# Patient Record
Sex: Female | Born: 1965 | Race: White | Hispanic: No | Marital: Married | Smoking: Never smoker
Health system: Southern US, Community
[De-identification: ages and names within clinical notes are randomized; demographics above are authoritative.]

## PROBLEM LIST (undated history)

## (undated) DIAGNOSIS — M1990 Unspecified osteoarthritis, unspecified site: Secondary | ICD-10-CM

## (undated) DIAGNOSIS — E7800 Pure hypercholesterolemia, unspecified: Secondary | ICD-10-CM

## (undated) DIAGNOSIS — Z309 Encounter for contraceptive management, unspecified: Secondary | ICD-10-CM

## (undated) HISTORY — DX: Unspecified osteoarthritis, unspecified site: M19.90

## (undated) HISTORY — DX: Pure hypercholesterolemia, unspecified: E78.00

## (undated) HISTORY — DX: Encounter for contraceptive management, unspecified: Z30.9

## (undated) HISTORY — PX: COLONOSCOPY: SHX174

## (undated) HISTORY — PX: WISDOM TOOTH EXTRACTION: SHX21

## (undated) HISTORY — PX: OTHER SURGICAL HISTORY: SHX169

---

## 2001-10-17 ENCOUNTER — Encounter: Payer: Self-pay | Admitting: Family Medicine

## 2001-10-17 ENCOUNTER — Ambulatory Visit (HOSPITAL_COMMUNITY): Admission: RE | Admit: 2001-10-17 | Discharge: 2001-10-17 | Payer: Self-pay | Admitting: Family Medicine

## 2002-05-18 ENCOUNTER — Ambulatory Visit (HOSPITAL_COMMUNITY): Admission: RE | Admit: 2002-05-18 | Discharge: 2002-05-18 | Payer: Self-pay | Admitting: Internal Medicine

## 2005-04-24 ENCOUNTER — Ambulatory Visit: Payer: Self-pay | Admitting: Internal Medicine

## 2005-04-29 ENCOUNTER — Encounter: Payer: Self-pay | Admitting: Internal Medicine

## 2005-04-29 ENCOUNTER — Ambulatory Visit (HOSPITAL_COMMUNITY): Admission: RE | Admit: 2005-04-29 | Discharge: 2005-04-29 | Payer: Self-pay | Admitting: Internal Medicine

## 2005-04-29 ENCOUNTER — Ambulatory Visit: Payer: Self-pay | Admitting: Internal Medicine

## 2005-06-04 ENCOUNTER — Ambulatory Visit: Payer: Self-pay | Admitting: Internal Medicine

## 2005-09-18 ENCOUNTER — Ambulatory Visit: Payer: Self-pay | Admitting: Internal Medicine

## 2005-12-24 ENCOUNTER — Ambulatory Visit: Payer: Self-pay | Admitting: Internal Medicine

## 2006-02-18 ENCOUNTER — Ambulatory Visit (HOSPITAL_COMMUNITY): Admission: RE | Admit: 2006-02-18 | Discharge: 2006-02-18 | Payer: Self-pay | Admitting: Obstetrics and Gynecology

## 2006-06-16 ENCOUNTER — Ambulatory Visit (HOSPITAL_COMMUNITY): Admission: RE | Admit: 2006-06-16 | Discharge: 2006-06-16 | Payer: Self-pay | Admitting: Obstetrics and Gynecology

## 2006-07-23 ENCOUNTER — Ambulatory Visit (HOSPITAL_COMMUNITY): Admission: RE | Admit: 2006-07-23 | Discharge: 2006-07-23 | Payer: Self-pay | Admitting: Family Medicine

## 2006-08-10 ENCOUNTER — Ambulatory Visit: Payer: Self-pay | Admitting: Internal Medicine

## 2006-11-15 ENCOUNTER — Ambulatory Visit: Payer: Self-pay | Admitting: Internal Medicine

## 2006-12-14 ENCOUNTER — Ambulatory Visit: Payer: Self-pay | Admitting: Internal Medicine

## 2007-01-03 ENCOUNTER — Ambulatory Visit: Payer: Self-pay | Admitting: Internal Medicine

## 2007-01-07 ENCOUNTER — Inpatient Hospital Stay (HOSPITAL_COMMUNITY): Admission: RE | Admit: 2007-01-07 | Discharge: 2007-01-11 | Payer: Self-pay | Admitting: Internal Medicine

## 2007-01-07 ENCOUNTER — Ambulatory Visit: Payer: Self-pay | Admitting: Internal Medicine

## 2007-01-07 ENCOUNTER — Encounter (INDEPENDENT_AMBULATORY_CARE_PROVIDER_SITE_OTHER): Payer: Self-pay | Admitting: Specialist

## 2007-01-07 ENCOUNTER — Ambulatory Visit (HOSPITAL_COMMUNITY): Admission: RE | Admit: 2007-01-07 | Discharge: 2007-01-07 | Payer: Self-pay | Admitting: Internal Medicine

## 2007-01-20 ENCOUNTER — Ambulatory Visit: Payer: Self-pay | Admitting: Internal Medicine

## 2007-01-31 ENCOUNTER — Ambulatory Visit: Payer: Self-pay | Admitting: Internal Medicine

## 2007-02-21 ENCOUNTER — Ambulatory Visit (HOSPITAL_COMMUNITY): Admission: RE | Admit: 2007-02-21 | Discharge: 2007-02-21 | Payer: Self-pay | Admitting: Obstetrics and Gynecology

## 2007-07-14 ENCOUNTER — Emergency Department (HOSPITAL_COMMUNITY): Admission: EM | Admit: 2007-07-14 | Discharge: 2007-07-15 | Payer: Self-pay | Admitting: Emergency Medicine

## 2007-11-25 ENCOUNTER — Ambulatory Visit (HOSPITAL_COMMUNITY): Admission: RE | Admit: 2007-11-25 | Discharge: 2007-11-25 | Payer: Self-pay | Admitting: Internal Medicine

## 2008-03-07 ENCOUNTER — Ambulatory Visit (HOSPITAL_COMMUNITY): Admission: RE | Admit: 2008-03-07 | Discharge: 2008-03-07 | Payer: Self-pay | Admitting: Obstetrics and Gynecology

## 2009-03-27 ENCOUNTER — Ambulatory Visit (HOSPITAL_COMMUNITY): Admission: RE | Admit: 2009-03-27 | Discharge: 2009-03-27 | Payer: Self-pay | Admitting: Obstetrics and Gynecology

## 2009-04-30 IMAGING — CR DG CHEST 2V
2 series · 2 of 2 positions shown · non-contrast
Comparison: none

CLINICAL DATA: Injury, pain.  
 PA AND LATERAL CHEST ? 2 VIEW:

[view not recorded (1 of 2)]
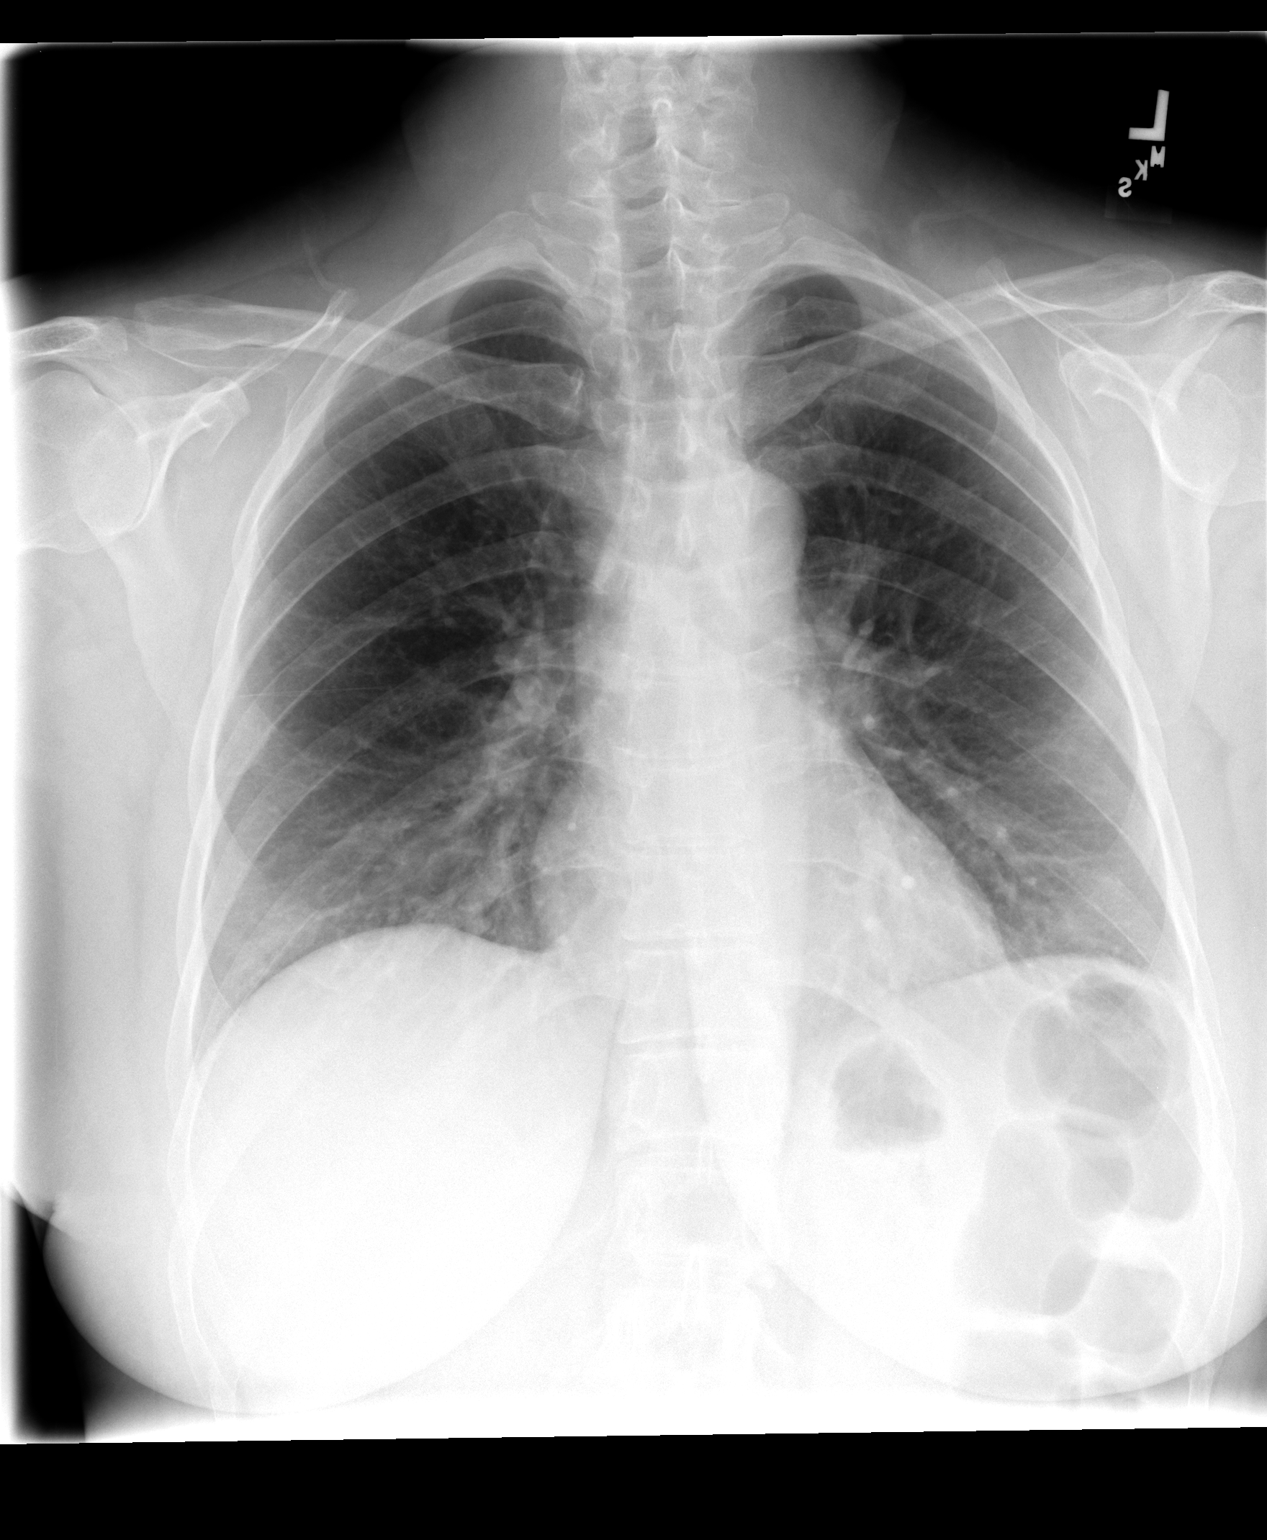

[view not recorded (2 of 2)]
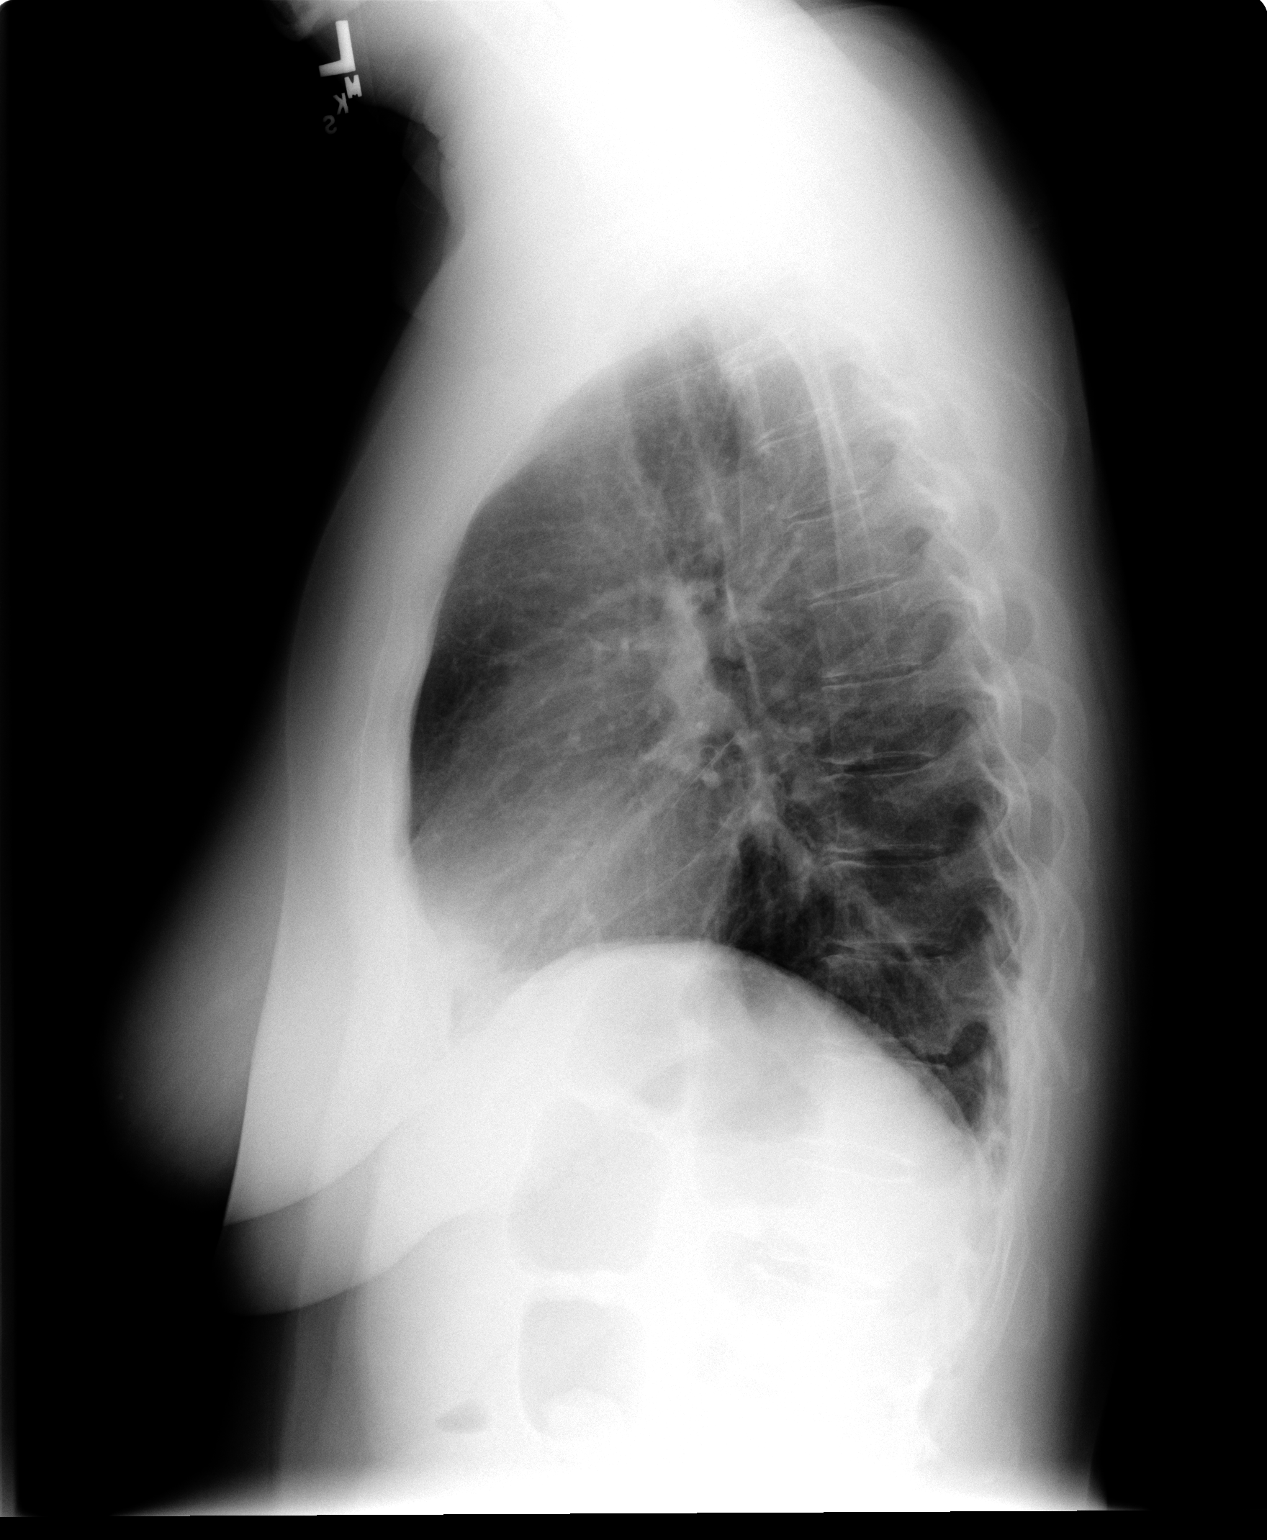

[2 of 2 positions shown; findings below may reference images not displayed]

FINDINGS: The lungs are clear.  No pneumothorax.  No pleural effusion.  Heart and mediastinum appear normal.  No focal bony abnormality.
IMPRESSION: Negative chest.

## 2009-07-19 HISTORY — PX: SIGMOIDOSCOPY: SUR1295

## 2010-03-31 ENCOUNTER — Ambulatory Visit (HOSPITAL_COMMUNITY): Admission: RE | Admit: 2010-03-31 | Discharge: 2010-03-31 | Payer: Self-pay | Admitting: Obstetrics and Gynecology

## 2010-04-03 ENCOUNTER — Ambulatory Visit (HOSPITAL_COMMUNITY): Admission: RE | Admit: 2010-04-03 | Discharge: 2010-04-03 | Payer: Self-pay | Admitting: Family Medicine

## 2010-10-28 ENCOUNTER — Ambulatory Visit: Payer: Self-pay | Admitting: Internal Medicine

## 2011-02-24 ENCOUNTER — Other Ambulatory Visit (HOSPITAL_COMMUNITY): Payer: Self-pay | Admitting: Obstetrics and Gynecology

## 2011-02-24 DIAGNOSIS — Z139 Encounter for screening, unspecified: Secondary | ICD-10-CM

## 2011-03-27 NOTE — Op Note (Signed)
[facility]  Patient:    Sandra Crane, Sandra Crane Visit Number: [id number] MRN: [id number]          Service Type: END Location: DAY Attending Physician:  Bridgette Habermann Dictated by:   Garfield Cornea, M.D. Admit Date:  05/18/2002 Discharge Date: 05/18/2002   CC:         [employer]mond [employer], M.D.   Operative Report  PROCEDURE:  Colonoscopy, biopsy, stool sampling.  INDICATIONS FOR PROCEDURE:  The patient is a 45 year old lady with a recent 10-day history of bloody diarrhea.  I saw this lady on May 10, 2002.  Symptoms were improving.  She tells me now that she is not passing any more blood per rectum.  Her stools are formed up; in fact, she tells me that she may have a bowel movement only every other day.  Colonoscopy is now being done to further evaluate blood per rectum.  This approach has been discussed with Ms. Dorothyann Peng at length.  The potential risks, benefits, and alternatives have been reviewed.  The patient is at low level for conscious sedation with Versed and Demerol.  Please see my May 10, 2002, consultation note for more information.  PROCEDURE NOTE:  O2 saturation, blood pressure, pulse, and respirations were monitored throughout the entire procedure.  CONSCIOUS SEDATION:  IV Versed and Demerol in incremental doses.  INSTRUMENT:  Olympus video chip colonoscope.  FINDINGS: Digital rectal examination revealed no abnormality.  ENDOSCOPIC FINDINGS:  Prep was good.  RECTUM:  Examination of rectal mucosa including retroflexed view of the anal verge revealed patchy erythema and scattered erosions in the proximal rectum; otherwise, the rectum appeared normal.  These changes were seen well into the sigmoid colon but tapered off to 40 cm.  Please see photos.  The colonoscope was easily advanced from the rectosigmoid junction to the left transverse and right colon to the area of the appendiceal orifice, ileocecal valve, and cecum. These structures were  well-seen and photographed for the record.  The colonic mucosa from the more proximal sigmoid onto the cecum appeared normal from the level of the cecum and ileocecal valve.  The scope was slowly withdrawn.  All previously mentioned mucosal surfaces were again seen, and again no other abnormalities were observed.  Biopsies of the sigmoid and rectal mucosa were taken.  Stool was suctioned for microbiology studies.  The patient tolerated the procedure well and was reacted in endoscopy.  IMPRESSION: Patchy erythema and erosions of the proximal rectum, extending into the sigmoid with normal-appearing colonic mucosa more proximally. Suspect the patient did, in fact, suffer a bout of food-borne illness.  Clinically, he is almost completely recovered.  Hopefully, inflammatory findings today are the residual of such an infection.  RECOMMENDATIONS: 1. Followup on pathology.  Followup on stool studies. 2. Further recommendations to follow. Dictated by:   Garfield Cornea, M.D. Attending Physician:  Bridgette Habermann DD:  05/18/02 TD:  05/22/02 Job: [id number]

## 2011-03-27 NOTE — Discharge Summary (Signed)
Sandra Crane, ABSHER             ACCOUNT NO.:  [id number]   MEDICAL RECORD NO.:  [id number]          PATIENT TYPE:  INP   LOCATION:  A301                          FACILITY:  [facility]   PHYSICIAN:  Hildred Laser, M.D.    DATE OF BIRTH:  1966-03-05   DATE OF ADMISSION:  01/07/2007  DATE OF DISCHARGE:  03/04/2008LH                               DISCHARGE SUMMARY   ADMITTING DIAGNOSIS:  Acute ulcerative colitis not responding to  outpatient therapy.   DISCHARGE DIAGNOSES:  1. 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  2. Hyperglycemia secondary to steroid therapy.   SERVICE:  [employer].   PROCEDURES:  1. Flexible sigmoidoscopy on January 07, 2007, by Dr. Laural Golden revealed      acute colitis involving the rectum, sigmoid and descending colon      segments.  Endoscopic appearance was not consistent with C. diff.      colitis.  Sigmoid colon biopsy obtained to rule out associated CMV      colitis.  Biopsy pending at time of discharge.  2. CT of the abdomen and pelvis on January 07, 2007, revealed a      hypoattenuating lesion in the posterior segment of the right lobe      of the liver measuring 1.8 x 2.2 x 2.4 cm, consistent with a simple      cyst.  Diffuse thickening of the colon extending from the rectum to      the cecum.  The cecum, ascending and proximal transverse colon      appeared more severely affected, all other processes pan colonic.      Tiny left ovarian cyst measuring 1.9 cm in diameter.   HISTORY OF PRESENT ILLNESS:  Sandra Crane is a 45 year old Caucasian female  who was diagnosed with ulcerative colitis in June of 2006 by Dr. Gala Romney.  She had been doing well up until about 7 weeks prior to her admission.  She began having bloody diarrhea.  Gradually, she developed worsening  diarrhea and abdominal pain.  She was seen in our office and had stool  studies which were negative for C. diff., O&P and cultures.  Four days  prior to admission she was started  on prednisone therapy.  She did not  feel better and began having worsening pain and copious bloody diarrhea  and losing weight.  CT showed changes of diffuse colitis more pronounced  in the proximal colon.  She underwent a flexible sigmoidoscopy which  showed florid changes of colitis.  Because she was not doing well on  outpatient therapy, admission was advised.  She had also mentioned  taking some antibiotics but this was after her diarrhea had already  started and it was not clear whether she had super-imposed C. diff.   HOSPITAL COURSE:  The patient was admitted after her flexible  sigmoidoscopy on January 07, 2007.  Again she had findings as outlined  above.  Endoscopic appearance was not felt to be consistent with C.  diff. colitis.  Biopsies were taken to rule out superimposed CMV  colitis; however, biopsies were still pending at time of discharge.  Upon admission she was started on IV fluids as well as p.o. Flagyl.  She  was begun on Solu-Medrol and Dilaudid for pain.  She was noted to have  an elevated glucose with no prior history.  She was covered with sliding  scale insulin.  This was felt to be due to her steroid therapy.  A CT of  the abdomen and pelvis which revealed pancolitis extending from the  rectum to the cecum, but more severely affected in the cecum, ascending  and proximal colon.  There was no abscesses.  No evidence of free air or  megacolon.  Initially she had mild leukocytosis and hypokalemia.  Her  hypokalemia resolved.  Her white count came down to 11,400.  Hemoglobin  also dropped to 10.9 with hydration, etc.  A PPD was placed on January 08, 2007, in case she needed to have Remicade therapy.  It was read at 48  hours and was negative.  On her third day of hospital stay Dr. Laural Golden  received a call from the lab stating that her stool culture, which was  done as an outpatient, had grown Salmonella.  At this point her Solu-  Medrol dose was dropped and she was  begun on Cipro 500 mg p.o. b.i.d.  By the day of discharge she was feeling much better.  She was  maintaining p.o.'s as well as her medications.  She was having no nausea  or vomiting and her abdominal pain had lessened.  She had also had a  decreased number of stools with more consistency and less blood.  It was  felt that she could be managed as an outpatient at this point.  With  regards to her hyperglycemia, her CBGs for the 24 hours prior to her  discharge ran in the 140's to 160's.  It was felt that with decreasing  dose of prednisone she would not need to have any insulin coverage for  this.   LABS:  On January 10, 2007, her white count was 11,400, hemoglobin 10.9,  hematocrit 32.2, platelets 488,000.  Sodium 137, potassium 4.2, BUN 6,  creatinine 0.66, albumin 2.3.  At time of discharge, her C. diff. was  negative.  Stool culture from January 07, 2007, was finalized as  negative, but no Salmonella isolated.  Biopsies from January 07, 2007,  were still pending.   PHYSICAL EXAM AT TIME OF DISCHARGE:  She had been afebrile her entire  mission.  Pulse 81, respirations 16, blood pressure 99/64.  CHEST:  Lungs are clear to auscultation.  CARDIAC:  Regular rate and rhythm.  ABDOMEN:  Positive bowel sounds, abdomen is soft.  She had mild left mid  abdominal tenderness to deep palpation.  LOWER EXTREMITIES:  No edema.   DISCHARGE MEDICATIONS:  1. Asacol 800 mg three times a day.  2. Multivitamin with calcium daily.  3. Prednisone 30 mg daily, decreased by 5 mg each week.  4. Cipro 500 mg b.i.d. for 10 days, prescription provided.  5. Bentyl 10 mgs. po tid prn.  6. Vicodin 5/500 one tid prn severe pain only.   DISPOSITION:  Stable at time of discharge.   FOLLOW UP:  She has an office visit scheduled with Dr. Gala Romney for  Thursday, March 13th at 10:30 a.m.  She has been instructed to go to [facility] lab the day prior to have a CBC with diff. and MET-  7, order provided to  patient.   She will continue low residue diet  with addition of yogurt 3 times  daily.  Will keep her out of work until her office visit.      Neil Crouch, P.A.      Hildred Laser, M.D.  Electronically Signed    LL/MEDQ  D:  01/11/2007  T:  01/11/2007  Job:  [id number]   cc:   Sherrilee Gilles. Gerarda Fraction, MD  Fax: [phone]   R. Garfield Cornea, M.D.  [address]  [place]  [place] [address]

## 2011-03-27 NOTE — Consult Note (Signed)
Sandra Crane, Sandra Crane             ACCOUNT NO.:  [id number]   MEDICAL RECORD NO.:  [id number]           PATIENT TYPE:  AMB   LOCATION:                                FACILITY:  [facility]   PHYSICIAN:  Hildred Laser, M.D.    DATE OF BIRTH:  September 03, 1966   DATE OF CONSULTATION:  DATE OF DISCHARGE:                                   CONSULTATION   REASON FOR CONSULTATION:  Rectal bleeding and diarrhea.   PHYSICIAN REQUESTING CONSULTATION:  Donnal Moat, PA, physician assistant  with Dr. Gerarda Fraction.   HISTORY OF PRESENT ILLNESS:  Sandra Crane is a 45 year old Caucasian female who  presents today for further evaluation of bloody diarrhea.  Symptoms began  about 1-1/2 weeks ago.  She is having up to six to seven bloody bowel  movements daily.  She notes over the last couple of days, the amount of  blood has decreased, however.  Her typical bowel movement pattern is usually  one stool every other day.  She complains of abdominal cramping associated  with this diarrhea.  Denies any nausea or vomiting, weight loss, heartburn  or fever.  She has had no recent antibiotic use, although currently is on  Cipro and Flagyl empirically.  She does have well water.  No one else around  her has been sick.  She has not traveled anywhere out of the country and has  not been camping.   In July 2003, we saw her for a similar episode of bloody diarrhea.  She had  a negative C. difficile culture and O&P at that time.  A colonoscopy  revealed patchy erythema and erosions of the proximal rectum extending into  the sigmoid with normal appearing colonic mucosa more proximally.  It was  felt that she suffered a bout of food-borne illness.  Biopsies revealed  focal cryptitis and crypt abscesses.  Her symptoms completely resolved at  that time, so nothing else was done.   CURRENT MEDICATIONS:  1.  Depo-Provera shot once every three months.  2.  Aleve p.r.n.  3.  Cipro 500 mg b.i.d. started on April 17, 2005.  4.  Flagyl 500 mg  b.i.d. started on April 17, 2005.   ALLERGIES:  No known drug allergies.   PAST MEDICAL HISTORY:  As above.  In addition, she has intermittent  headaches.  She has had three cesarean sections.   FAMILY HISTORY:  She is negative for colorectal cancer or inflammatory bowel  disease.   SOCIAL HISTORY:  She is married and has two daughters and one son.  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  She  has never been a smoker.  Denies any alcohol use.   REVIEW OF SYSTEMS:  See HPI for GI and constitutional.  Cardiopulmonary:  Denies chest pain or shortness of breath.   PHYSICAL EXAMINATION:  VITAL SIGNS:  Weight 159, height 5 feet 3 inches.  Temperature 99.3, blood pressure 122/78, pulse 84.  GENERAL:  Pleasant, well-nourished, well-developed Caucasian female in no  acute distress.  SKIN:  Warm and dry.  No jaundice.  HEENT:  Pupils equal, round, reactive to light.  Conjunctivae are pink.  Sclerae nonicteric.  Oropharyngeal mucosa moist and pink.  No  lymphadenopathy, thyromegaly.  CHEST:  Lung sounds clear to auscultation.  CARDIOVASCULAR:  Reveals regular rate and rhythm.  No murmurs, rubs or  gallops.  ABDOMEN:  Positive bowel sounds.  Soft, nondistended.  She has mild right  lower quadrant tenderness to deep palpation.  No organomegaly or masses.  No  rebound tenderness or guarding.  No abdominal bruits or hernias.  EXTREMITIES:  No edema.  RECTAL:  Deferred.  Was done last week by Donnal Moat, PA.  She reported to  me via phone that there was gross blood per rectum.   LABORATORY:  On April 17, 2005, her white count was 8500, hemoglobin 11.7,  hematocrit 37.6, MCV 90.2, platelets 395,000, eosinophil count 9%.  MET-7,  LFTs were normal.  She had more blood work on April 22, 2005 which revealed  white count of 7000, hemoglobin 11.5, hematocrit 35.2, MCV 88.2, platelets  361,000.  B-12 365.  Folate 18.3.  Iron 70.  TIBC 356.  Iron saturation 20%  and  ferritin was 6.   IMPRESSION:  Sandra Crane is a 45 year old Caucasian female with a 1-1/2 week  history of bloody diarrhea associated with mild anemia and low ferritin.  Symptoms were very similar to the episode in 2003 at which time she was  found to have patchy erythema of the proximal rectum and sigmoid colon.  Biopsies revealed cryptitis and crypt abscesses at that time.  It was felt  that this was probably resolving infection.  She has done very well up until  a week and a half ago.  At this point, we would be concerned about IBD.  She  is on Cipro and Flagyl and not having any improvement of the number of her  stools, although her bleeding has improved.  We need to go ahead and  reevaluate her colon via colonoscopy.   PLAN:  1.  Colonoscopy next week with Dr. Gala Romney.  Possible terminal ileoscopy based      on findings.  2.  She will continue Cipro and Flagyl for now.  3.  Further recommendations to follow.  4.  She has been advised of warning symptoms and will notify us if she has      increased bleeding, worsening abdominal pain, diarrhea or fever.   Please note, Dr. Laural Crane is cosigning note in the absence of Dr. Gala Romney, who  is the patient's primary GI physician.     LL/MEDQ  D:  04/24/2005  T:  04/24/2005  Job:  [id number]   cc:   Donnal Moat, PA

## 2011-03-27 NOTE — Op Note (Signed)
NAMEJAEDYNN, Sandra Crane             ACCOUNT NO.:  [id number]   MEDICAL RECORD NO.:  [id number]          PATIENT TYPE:  INP   LOCATION:  A301                          FACILITY:  [facility]   PHYSICIAN:  Hildred Laser, M.D.    DATE OF BIRTH:  10/13/1966   DATE OF PROCEDURE:  01/07/2007  DATE OF DISCHARGE:  01/11/2007                               OPERATIVE REPORT   PROCEDURE:  Flexible sigmoidoscopy.   INDICATIONS:  [name] is a 45 year old Caucasian female who was  diagnosed with UC back in June 2006 and has done well on mesalamine  until about 6 or 7 weeks ago, when she developed bloody diarrhea.  She  was seen in the office over 2 weeks ago and had stool studies which were  negative.  She was seen again 4 days ago and begun on prednisone 40 mg  daily.  She took her fourth dose today.  However, she has not felt any  better.  She has been having excruciating pain across her upper abdomen  with multiple bloody bowel movements daily.  She has a poor appetite.  She has been nauseated and she has lost about 12-13 pounds since her  illness began.  She had abdominopelvic CT this afternoon as recommended  by Dr. Gala Romney.  It shows the changes of diffuse colitis, more prominent  in the proximal colon.  She is undergoing a diagnostic flexible  sigmoidoscopy to make sure she does not have C. difficile or CMV  colitis.  Please note that her C. difficile toxin titer has been  negative.   The procedure risks were reviewed with the patient and informed consent  was obtained.   MEDICATIONS FOR CONSCIOUS SEDATION:  Demerol 25 mg IV, Versed 4 mg IV.   FINDINGS:  Procedure performed in endoscopy suite.  The patient's vital  signs and O2 saturation were monitored during the procedure and remained  stable.  The patient was placed in the left lateral recumbent position  and rectal examination performed.  No abnormality noted on external or  digital exam.  Pentax video scope was placed in the rectum, where  mucosa  was noted to be diffusely erythematous, friable, with multiple small  ulcers but no pseudomembranes were noted.  There was some liquid stool  in the sigmoid colon, which was aspirated for studies.  The scope was  passed into splenic flexure and similar changes were noted in the  segments that were examined.  Pictures taken followed by biopsy from the  sigmoid colon.  Endoscope was withdrawn.  The patient tolerated the  procedure well.   FINAL DIAGNOSIS:  Acute colitis involving the rectum, sigmoid and  descending colon segments which were examined.  Endoscopic appearance  not consistent with Clostridium difficile colitis.   A sigmoid colon biopsy obtained to rule out see associated  cytomegalovirus colitis.   RECOMMENDATIONS:  1. She will be hospitalized for IV fluids and IV steroid therapy.      Stools sent for C. difficile toxin titer and culture.  2. Will also give her p.o. Flagyl and IV Dilaudid for pain control.  Hildred Laser, M.D.  Electronically Signed     NR/MEDQ  D:  01/07/2007  T:  01/08/2007  Job:  [id number]   cc:   R. Garfield Cornea, M.D.  [address]  [place]  [place] [address]   Roy O. Willey Blade, MD  Fax: [phone]

## 2011-03-27 NOTE — Op Note (Signed)
NAMELATEESHA, Sandra Crane             ACCOUNT NO.:  [id number]   MEDICAL RECORD NO.:  [id number]          PATIENT TYPE:  AMB   LOCATION:  DAY                           FACILITY:  [facility]   PHYSICIAN:  R. Garfield Cornea, M.D. DATE OF BIRTH:  02/03/1966   DATE OF PROCEDURE:  04/29/2005  DATE OF DISCHARGE:                                 OPERATIVE REPORT   PROCEDURE PERFORMED:  Ileoscopy, segmental biopsy, stool collection.   INDICATIONS FOR PROCEDURE:  The patient is a 45 year old lady with a two-  week history of bloody diarrhea, not improved with a course of Cipro and  Flagyl.  She had a similar episode in 2003.  She was found to have some  patchy sigmoid colitis and proctitis.  Her symptoms apparently resolved  until recently.  Colonoscopy is now being done.  This approach has been  discussed with the patient at length.  Potential risks, benefits and  alternatives have been reviewed, questions have been answered, she is  agreeable.  Please see documentation in the medical records.   PROCEDURE NOTE:  Oxygen saturations, blood pressure, pulse and respirations  were monitored throughout the entire procedure.  Conscious sedation Versed 4  mg IV, Demerol 100 mg IV in divided doses.   INSTRUMENT USED:  Olympus video chip system.   FINDINGS:  Digital rectal exam revealed no abnormalities.   ENDOSCOPIC FINDINGS:  Prep was adequate.   Rectum:  Examination of the rectal mucosa including retroflex view of the  anal verge revealed internal hemorrhoids and diffusely granular rectal  mucosa with nearly complete loss of the normal-appearing vascular pattern.  There was granularity and superficial erosions diffusely.  No frank  ulcerations were seen.  These inflammatory changes extended up until  approximately 35 cm well into the sigmoid colon.  The colonoscope was easily  advanced to the cecum.  The cecum, ileocecal valve, appendiceal orifice was  well seen and photographed for the record.   Although the mucosal erosions  tapered off at 35 cm, the colonic mucosa had a granular appearance all the  way to the cecum but no other abnormalities were observed. The terminal  ileum was intubated to 10 cm.  This segment of the GI tract appeared normal.  From the level of the cecum and ileocecal valve, the scope was slowly and  cautiously withdrawn.  Segmental biopsies from the ascending, sigmoid and  rectal mucosa were taken and submitted separately to the pathologist.  Also  stool sample was taken for microbiology studies.   The patient tolerated the procedure well, was reacted in endoscopy.   IMPRESSION:  Inflammatory changes of the rectum and entire colon (more  pronounced in the rectum and sigmoid colon as described above) consistent  with __________  colitis, normal terminal ileum.  Segmental biopsies and  stool sample obtained.   I suspect that the patient has inflammatory bowel disease.   RECOMMENDATIONS:  Will follow up on stool studies.  Will go ahead and embark  on treatment for inflammatory bowel disease in the way of cortisone enemas,  one per rectum at bedtime times two weeks.  Will  begin Asacol two tablets  orally three times daily.  Plan to see this nice lady back in the office in  four weeks.  Follow-up on the biopsies and stool studies in the interim.       RMR/MEDQ  D:  04/29/2005  T:  04/29/2005  Job:  [id number]   cc:   Donnal Moat, PA

## 2011-03-27 NOTE — H&P (Signed)
NAMEJANNIS, Sandra Crane             ACCOUNT NO.:  [id number]   MEDICAL RECORD NO.:  [id number]          PATIENT TYPE:  INP   LOCATION:  A301                          FACILITY:  [facility]   PHYSICIAN:  Hildred Laser, M.D.    DATE OF BIRTH:  Apr 03, 1966   DATE OF ADMISSION:  01/07/2007  DATE OF DISCHARGE:  LH                              HISTORY & PHYSICAL   REASON FOR HOSPITALIZATION:  Acute ulcerative colitis not responding to  outpatient therapy.   HISTORY OF PRESENT ILLNESS:  Sandra Crane is a is 45 year old Caucasian  female who was diagnosed with UC back in June of 2006 by Dr. Gala Romney.  She  has been maintained on mesalamine and was doing fine until about 7 weeks  ago when she started to experience bloody diarrhea.  Gradually her  diarrhea got worse, and she also began to experience some abdominal  pain.  She was seen in the office over 2 weeks ago.  She had stool  studies which were negative for C diff, O&P and cultures.  The patient  has been maintained on her mesalamine.  She was seen again 4 days ago.  She was begun on prednisone 40 mg q.a.m.; however, she has not felt any  better.  The patient's husband called our office stating that she was  having excruciating pain across her upper abdomen.  She was not able to  eat anything, and she was still having copious bloody bowel movements  and was losing weight.  She was brought for abdominopelvic CT.  The CT  shows changes of diffuse colitis more pronounced in the proximal colon  but no evidence of free air or toxic megacolon.  She underwent flexible  sigmoidoscopy which showed florid changes of colitis.  Because she is  not doing well with outpatient therapy, admission was advised.   The patient states that she did take some antibiotics, but this was  after diarrhea had started.  She has not experienced any fever, but she  has had poor appetite.  She has lost about 5 pounds.  She has been  having several bloody bowel movements per day.  She  has not had any  fever or chills.  She does not take any NSAIDs.   USUAL MEDICATIONS:  1. Depo-Provera injection every 3 months, the last dose in December of      2007.  2. Asacol 800 mg three times a day.  3. MVI with calcium daily.  4. Prednisone 40 mg p.o. daily.  5. Rowasa enema 4 grams PR q.h.s.   PAST MEDICAL HISTORY:  UC was diagnosed in June of 2006.  She does not  have any other medical problems.   PAST SURGICAL HISTORY:  She has had 3 C-sections, the last one of which  was in 1994.   ALLERGIES:  No known drug allergies.   FAMILY HISTORY:  Mother has some health problems but doing fairly well.  Health of father is unknown.  She has 5 siblings in good health.  Extended family history is negative for inflammatory bowel disease.   SOCIAL HISTORY:  She is married.  She has 3 healthy children.  She has  never smoked cigarettes and does not drink alcohol.  She works as a  Oceanographer.   PHYSICAL EXAMINATION:  GENERAL:  A pleasant, well-developed, well-  nourished Caucasian female who appears to be ill but in no acute  distress.  VITAL SIGNS:  Admission weight 70.3 kg.  She is 5 feet 3 inches tall.  Pulse 116 per minute, blood pressure 112/74, respirations 16 and  temperature is 99.7.  HEENT:  Conjunctivae is pink.  Sclerae is nonicteric.  Oropharyngeal  mucosa is normal.  No neck masses are noted.  CARDIAC:  Regular rhythm.  Normal S1 and S2.  No murmur or gallop noted.  LUNGS:  Clear to auscultation.  ABDOMEN:  Symmetrical.  Bowel sounds are normal.  On palpation, it is  soft with moderate mid epigastric tenderness but no organomegaly or  masses noted.  RECTAL:  Rectal examination performed at the time of sigmoidoscopy was  normal.  EXTREMITIES:  No peripheral edema or clubbing noted.   ASSESSMENT:  Sandra Crane has acute ulcerative colitis which has not  responded to outpatient therapy.  It is unclear to me as to the reason  for her relapse.  There were no obvious  triggers.  She did use some  antibiotic, but this clearly appears to be after the onset of her bloody  diarrhea not before.  She has diffuse disease.  Her colon does not  appear to be very dilated, and she does not appear to be toxic.  She  needs to be hospitalized for IV fluids and IV therapy.   Sigmoid colon biopsies were obtained to rule out CMV colitis, which I  doubt very much, given that she has not been on immunomodulator therapy.   PLAN:  Start on IV fluids, p.o. Flagyl at 250 mg p.o. t.i.d., Dilaudid 1  mg IV q.3h. p.r.n. pain, Solu-Medrol 40 mg IV q.12h.  She will be  maintained on full liquids with yogurt.  Further issues in her  management will depend on colonic biopsies.      Hildred Laser, M.D.  Electronically Signed     NR/MEDQ  D:  01/07/2007  T:  01/07/2007  Job:  [id number]   cc:   Sherrilee Gilles. Gerarda Fraction, MD  Fax: [phone]

## 2011-04-13 ENCOUNTER — Ambulatory Visit (HOSPITAL_COMMUNITY)
Admission: RE | Admit: 2011-04-13 | Discharge: 2011-04-13 | Disposition: A | Discharge status: Home / Self Care | Payer: BC Managed Care – PPO | Source: Ambulatory Visit | Attending: Obstetrics and Gynecology | Admitting: Obstetrics and Gynecology

## 2011-04-13 DIAGNOSIS — Z1231 Encounter for screening mammogram for malignant neoplasm of breast: Secondary | ICD-10-CM | POA: Insufficient documentation

## 2011-04-13 DIAGNOSIS — Z139 Encounter for screening, unspecified: Secondary | ICD-10-CM

## 2011-05-12 ENCOUNTER — Ambulatory Visit (INDEPENDENT_AMBULATORY_CARE_PROVIDER_SITE_OTHER): Payer: BC Managed Care – PPO | Admitting: Internal Medicine

## 2011-05-12 DIAGNOSIS — K5090 Crohn's disease, unspecified, without complications: Secondary | ICD-10-CM

## 2011-06-11 ENCOUNTER — Telehealth (INDEPENDENT_AMBULATORY_CARE_PROVIDER_SITE_OTHER): Payer: Self-pay | Admitting: *Deleted

## 2011-06-11 DIAGNOSIS — K5190 Ulcerative colitis, unspecified, without complications: Secondary | ICD-10-CM

## 2011-06-11 NOTE — Telephone Encounter (Signed)
Lab order faxed and letter sent to the patient.

## 2011-06-30 ENCOUNTER — Other Ambulatory Visit (INDEPENDENT_AMBULATORY_CARE_PROVIDER_SITE_OTHER): Payer: Self-pay | Admitting: Internal Medicine

## 2011-07-01 LAB — CBC WITH DIFFERENTIAL/PLATELET
Basophils Relative: 0 % (ref 0–1)
Eosinophils Absolute: 0.1 10*3/uL (ref 0.0–0.7)
Eosinophils Relative: 2 % (ref 0–5)
HCT: 41.9 % (ref 36.0–46.0)
Hemoglobin: 13.5 g/dL (ref 12.0–15.0)
MCH: 32.4 pg (ref 26.0–34.0)
MCHC: 32.2 g/dL (ref 30.0–36.0)
MCV: 100.5 fL — ABNORMAL HIGH (ref 78.0–100.0)
Monocytes Absolute: 0.8 10*3/uL (ref 0.1–1.0)
Monocytes Relative: 17 % — ABNORMAL HIGH (ref 3–12)

## 2011-07-01 LAB — HEPATIC FUNCTION PANEL
ALT: 23 U/L (ref 0–35)
AST: 18 U/L (ref 0–37)
Albumin: 4.4 g/dL (ref 3.5–5.2)

## 2011-07-09 ENCOUNTER — Telehealth (INDEPENDENT_AMBULATORY_CARE_PROVIDER_SITE_OTHER): Payer: Self-pay | Admitting: *Deleted

## 2011-07-09 NOTE — Telephone Encounter (Signed)
Patient to have labs in 3 months

## 2011-08-31 ENCOUNTER — Other Ambulatory Visit: Payer: Self-pay | Admitting: Adult Health

## 2011-08-31 ENCOUNTER — Other Ambulatory Visit (HOSPITAL_COMMUNITY)
Admission: RE | Admit: 2011-08-31 | Discharge: 2011-08-31 | Disposition: A | Discharge status: Home / Self Care | Payer: BC Managed Care – PPO | Source: Ambulatory Visit | Attending: Obstetrics and Gynecology | Admitting: Obstetrics and Gynecology

## 2011-08-31 DIAGNOSIS — Z01419 Encounter for gynecological examination (general) (routine) without abnormal findings: Secondary | ICD-10-CM | POA: Insufficient documentation

## 2011-09-16 ENCOUNTER — Encounter (INDEPENDENT_AMBULATORY_CARE_PROVIDER_SITE_OTHER): Payer: Self-pay | Admitting: *Deleted

## 2011-10-07 ENCOUNTER — Other Ambulatory Visit (INDEPENDENT_AMBULATORY_CARE_PROVIDER_SITE_OTHER): Payer: Self-pay | Admitting: Internal Medicine

## 2011-10-08 LAB — CBC WITH DIFFERENTIAL/PLATELET
Basophils Absolute: 0 10*3/uL (ref 0.0–0.1)
Basophils Relative: 0 % (ref 0–1)
Eosinophils Relative: 2 % (ref 0–5)
Lymphocytes Relative: 21 % (ref 12–46)
MCHC: 32.8 g/dL (ref 30.0–36.0)
Neutro Abs: 3.1 10*3/uL (ref 1.7–7.7)
Platelets: 257 10*3/uL (ref 150–400)
RDW: 14.2 % (ref 11.5–15.5)
WBC: 5 10*3/uL (ref 4.0–10.5)

## 2011-10-08 LAB — HEPATIC FUNCTION PANEL
Indirect Bilirubin: 0.3 mg/dL (ref 0.0–0.9)
Total Protein: 6.7 g/dL (ref 6.0–8.3)

## 2011-10-12 ENCOUNTER — Telehealth (INDEPENDENT_AMBULATORY_CARE_PROVIDER_SITE_OTHER): Payer: Self-pay | Admitting: *Deleted

## 2011-10-12 NOTE — Telephone Encounter (Signed)
Per Dr. Laural Golden the patient is to have labs in 3 months. Labs have been noted and the patient will be sent a reminder.

## 2012-01-07 ENCOUNTER — Telehealth (INDEPENDENT_AMBULATORY_CARE_PROVIDER_SITE_OTHER): Payer: Self-pay | Admitting: *Deleted

## 2012-01-07 ENCOUNTER — Encounter (INDEPENDENT_AMBULATORY_CARE_PROVIDER_SITE_OTHER): Payer: Self-pay | Admitting: *Deleted

## 2012-01-07 NOTE — Telephone Encounter (Signed)
Patient requested this be done with her labs that are due. As they are needed by PCP

## 2012-01-08 ENCOUNTER — Other Ambulatory Visit (INDEPENDENT_AMBULATORY_CARE_PROVIDER_SITE_OTHER): Payer: Self-pay | Admitting: Internal Medicine

## 2012-01-09 LAB — CBC WITH DIFFERENTIAL/PLATELET
Basophils Absolute: 0 10*3/uL (ref 0.0–0.1)
Basophils Relative: 1 % (ref 0–1)
Eosinophils Absolute: 0.1 10*3/uL (ref 0.0–0.7)
Eosinophils Relative: 2 % (ref 0–5)
HCT: 44.2 % (ref 36.0–46.0)
Hemoglobin: 13.9 g/dL (ref 12.0–15.0)
MCH: 32.8 pg (ref 26.0–34.0)
MCHC: 31.4 g/dL (ref 30.0–36.0)
MCV: 104.2 fL — ABNORMAL HIGH (ref 78.0–100.0)
Monocytes Absolute: 0.7 10*3/uL (ref 0.1–1.0)
Monocytes Relative: 18 % — ABNORMAL HIGH (ref 3–12)
Neutro Abs: 2.2 10*3/uL (ref 1.7–7.7)
RDW: 13.8 % (ref 11.5–15.5)

## 2012-01-09 LAB — LIPID PANEL
Cholesterol: 130 mg/dL (ref 0–200)
HDL: 40 mg/dL (ref >39)
LDL Cholesterol: 75 mg/dL (ref 0–99)
Triglycerides: 75 mg/dL (ref <150)

## 2012-01-09 LAB — HEPATIC FUNCTION PANEL
ALT: 33 U/L (ref 0–35)
AST: 24 U/L (ref 0–37)
Albumin: 4.6 g/dL (ref 3.5–5.2)
Total Protein: 7 g/dL (ref 6.0–8.3)

## 2012-01-11 ENCOUNTER — Telehealth (INDEPENDENT_AMBULATORY_CARE_PROVIDER_SITE_OTHER): Payer: Self-pay | Admitting: *Deleted

## 2012-01-11 NOTE — Telephone Encounter (Signed)
Per Dr. Laural Golden the patient will need repeat lab in 4 weeks. She is taking 6 MP.

## 2012-01-29 ENCOUNTER — Encounter (INDEPENDENT_AMBULATORY_CARE_PROVIDER_SITE_OTHER): Payer: Self-pay | Admitting: *Deleted

## 2012-02-09 ENCOUNTER — Other Ambulatory Visit (INDEPENDENT_AMBULATORY_CARE_PROVIDER_SITE_OTHER): Payer: Self-pay | Admitting: Internal Medicine

## 2012-02-10 LAB — CBC WITH DIFFERENTIAL/PLATELET
Basophils Absolute: 0 10*3/uL (ref 0.0–0.1)
Eosinophils Relative: 1 % (ref 0–5)
Lymphocytes Relative: 23 % (ref 12–46)
Lymphs Abs: 1 10*3/uL (ref 0.7–4.0)
Neutrophils Relative %: 64 % (ref 43–77)
Platelets: 289 10*3/uL (ref 150–400)
RBC: 4.39 MIL/uL (ref 3.87–5.11)
RDW: 13.7 % (ref 11.5–15.5)
WBC: 4.5 10*3/uL (ref 4.0–10.5)

## 2012-02-16 ENCOUNTER — Telehealth (INDEPENDENT_AMBULATORY_CARE_PROVIDER_SITE_OTHER): Payer: Self-pay | Admitting: *Deleted

## 2012-02-16 DIAGNOSIS — K5190 Ulcerative colitis, unspecified, without complications: Secondary | ICD-10-CM

## 2012-02-16 NOTE — Telephone Encounter (Signed)
Per Dr.Rehman repeat the CBC in 8 weeks, noted for June.

## 2012-02-18 ENCOUNTER — Encounter (INDEPENDENT_AMBULATORY_CARE_PROVIDER_SITE_OTHER): Payer: Self-pay | Admitting: *Deleted

## 2012-02-24 ENCOUNTER — Telehealth (INDEPENDENT_AMBULATORY_CARE_PROVIDER_SITE_OTHER): Payer: Self-pay | Admitting: *Deleted

## 2012-02-24 NOTE — Telephone Encounter (Signed)
Sandra Crane left a message today asking what she may be able to take for a inflamed muscle or joint. She has started jogging and this is the cause of this discomfort, Her School Nurse told her that she felt that it was inflamed and [name] knows that due to her Colitis she has to be cautious of what she takes.  Per Dr.Rehman the patient may take Advil 200 mg up to three times a day but NOT exceed 2 weeks of therapy. The patient was called and made aware via a message on her cell phone.

## 2012-03-30 ENCOUNTER — Other Ambulatory Visit: Payer: Self-pay | Admitting: Adult Health

## 2012-03-30 DIAGNOSIS — IMO0001 Reserved for inherently not codable concepts without codable children: Secondary | ICD-10-CM

## 2012-04-08 ENCOUNTER — Other Ambulatory Visit (INDEPENDENT_AMBULATORY_CARE_PROVIDER_SITE_OTHER): Payer: Self-pay | Admitting: *Deleted

## 2012-04-08 ENCOUNTER — Encounter (INDEPENDENT_AMBULATORY_CARE_PROVIDER_SITE_OTHER): Payer: Self-pay | Admitting: *Deleted

## 2012-04-08 DIAGNOSIS — K5190 Ulcerative colitis, unspecified, without complications: Secondary | ICD-10-CM

## 2012-04-13 ENCOUNTER — Other Ambulatory Visit (INDEPENDENT_AMBULATORY_CARE_PROVIDER_SITE_OTHER): Payer: Self-pay | Admitting: *Deleted

## 2012-04-13 NOTE — Telephone Encounter (Signed)
Patient called office asking if we would refill the Crestor 20 mg , as Dr.Rehman  Has been doing this for her. Per Dr.Rehman may call in Crestor 20 mg Take 1/2 tablet (10 mg) by mouth daily -#30 with 11 refills. This was called to [facility].

## 2012-04-14 ENCOUNTER — Ambulatory Visit (HOSPITAL_COMMUNITY)
Admission: RE | Admit: 2012-04-14 | Discharge: 2012-04-14 | Disposition: A | Discharge status: Home / Self Care | Payer: BC Managed Care – PPO | Source: Ambulatory Visit | Attending: Adult Health | Admitting: Adult Health

## 2012-04-14 DIAGNOSIS — Z1231 Encounter for screening mammogram for malignant neoplasm of breast: Secondary | ICD-10-CM | POA: Insufficient documentation

## 2012-04-14 DIAGNOSIS — IMO0001 Reserved for inherently not codable concepts without codable children: Secondary | ICD-10-CM

## 2012-04-14 LAB — CBC WITH DIFFERENTIAL/PLATELET
Basophils Absolute: 0 10*3/uL (ref 0.0–0.1)
Basophils Relative: 1 % (ref 0–1)
HCT: 41.9 % (ref 36.0–46.0)
Hemoglobin: 14.1 g/dL (ref 12.0–15.0)
Lymphocytes Relative: 13 % (ref 12–46)
MCHC: 33.7 g/dL (ref 30.0–36.0)
Monocytes Absolute: 1.1 10*3/uL — ABNORMAL HIGH (ref 0.1–1.0)
Neutro Abs: 3.9 10*3/uL (ref 1.7–7.7)
Neutrophils Relative %: 63 % (ref 43–77)
RDW: 13.5 % (ref 11.5–15.5)
WBC: 6.2 10*3/uL (ref 4.0–10.5)

## 2012-04-18 ENCOUNTER — Telehealth (INDEPENDENT_AMBULATORY_CARE_PROVIDER_SITE_OTHER): Payer: Self-pay | Admitting: *Deleted

## 2012-04-18 DIAGNOSIS — K5120 Ulcerative (chronic) proctitis without complications: Secondary | ICD-10-CM

## 2012-04-18 NOTE — Telephone Encounter (Signed)
Per Dr.Rehman the patient will need to have a CBC/D in 3 months. Lab noted for September.

## 2012-04-20 ENCOUNTER — Other Ambulatory Visit: Payer: Self-pay | Admitting: Adult Health

## 2012-04-20 DIAGNOSIS — R928 Other abnormal and inconclusive findings on diagnostic imaging of breast: Secondary | ICD-10-CM

## 2012-04-25 ENCOUNTER — Other Ambulatory Visit: Payer: Self-pay | Admitting: Adult Health

## 2012-04-25 DIAGNOSIS — R928 Other abnormal and inconclusive findings on diagnostic imaging of breast: Secondary | ICD-10-CM

## 2012-05-02 ENCOUNTER — Ambulatory Visit
Admission: RE | Admit: 2012-05-02 | Discharge: 2012-05-02 | Disposition: A | Discharge status: Home / Self Care | Payer: BC Managed Care – PPO | Source: Ambulatory Visit | Attending: Adult Health | Admitting: Adult Health

## 2012-05-02 DIAGNOSIS — R928 Other abnormal and inconclusive findings on diagnostic imaging of breast: Secondary | ICD-10-CM

## 2012-05-26 ENCOUNTER — Encounter (INDEPENDENT_AMBULATORY_CARE_PROVIDER_SITE_OTHER): Payer: Self-pay | Admitting: *Deleted

## 2012-06-27 ENCOUNTER — Other Ambulatory Visit (INDEPENDENT_AMBULATORY_CARE_PROVIDER_SITE_OTHER): Payer: Self-pay | Admitting: Internal Medicine

## 2012-07-04 ENCOUNTER — Encounter (INDEPENDENT_AMBULATORY_CARE_PROVIDER_SITE_OTHER): Payer: Self-pay | Admitting: Internal Medicine

## 2012-07-04 ENCOUNTER — Ambulatory Visit (INDEPENDENT_AMBULATORY_CARE_PROVIDER_SITE_OTHER): Payer: BC Managed Care – PPO | Admitting: Internal Medicine

## 2012-07-04 VITALS — BP 110/70 | HR 74 | Temp 97.6°F | Resp 20 | Ht 63.0 in | Wt 161.7 lb

## 2012-07-04 DIAGNOSIS — E785 Hyperlipidemia, unspecified: Secondary | ICD-10-CM | POA: Insufficient documentation

## 2012-07-04 DIAGNOSIS — K5190 Ulcerative colitis, unspecified, without complications: Secondary | ICD-10-CM | POA: Insufficient documentation

## 2012-07-04 NOTE — Patient Instructions (Signed)
CBC in October 2013.

## 2012-07-04 NOTE — Progress Notes (Signed)
Presenting complaint;  Followup for ulcerative colitis.   Subjective:  Patient is 46 year old Caucasian female who is in for scheduled visit. She feels fine. She has good appetite. She is exercising and trying to lose some weight. She denies abdominal pain diarrhea or rectal bleeding. She is not having any side effects with medications.  Current Medications: Current Outpatient Prescriptions  Medication Sig Dispense Refill  . Calcium Carbonate-Vitamin D (CALCIUM + D PO) Take by mouth daily.      Marland Kitchen LIALDA 1.2 G EC tablet TAKE TWO TABLETS BY MOUTH TWICE DAILY  120 each  11  . medroxyPROGESTERone (DEPO-PROVERA) 150 MG/ML injection Inject 150 mg into the muscle every 3 (three) months. Last injection was June 19 th , 2013      . mercaptopurine (PURINETHOL) 50 MG tablet Take 50 mg by mouth daily. Give on an empty stomach 1 hour before or 2 hours after meals. Caution: Chemotherapy.      . Pediatric Multivit-Minerals-C (KIDS GUMMY BEAR VITAMINS PO) Take by mouth daily.      . rosuvastatin (CRESTOR) 10 MG tablet Take 10 mg by mouth daily.         Objective: Blood pressure 110/70, pulse 74, temperature 97.6 F (36.4 C), temperature source Oral, resp. rate 20, height 5' 3"  (1.6 m), weight 161 lb 11.2 oz (73.347 kg). Patient is alert and in no acute distress. Conjunctiva is pink. Sclera is nonicteric Oropharyngeal mucosa is normal. No neck masses or thyromegaly noted. Abdomen is soft and nontender without organomegaly or masses. No LE edema or clubbing noted.  Labs/studies Results: CBC from 04/14/2012. WBC 6.2, H&H 14.1 and 41.9, platelet count 293K LFTs were normal on 01/08/2012. AST was 24 and ALT 33   Assessment: #1. Ulcerative colitis. She remains in remission. She is on low-dose 6-MP and Lialda, #2. History of elevated transaminases possibly secondary to statin. She is tolerating Crestor at lower dose.   Plan:  Continue current therapy. CBC in October,2013. Office visit in 1  year.

## 2012-07-05 ENCOUNTER — Ambulatory Visit (INDEPENDENT_AMBULATORY_CARE_PROVIDER_SITE_OTHER): Payer: BC Managed Care – PPO | Admitting: Internal Medicine

## 2012-07-07 ENCOUNTER — Other Ambulatory Visit (INDEPENDENT_AMBULATORY_CARE_PROVIDER_SITE_OTHER): Payer: Self-pay | Admitting: *Deleted

## 2012-07-07 ENCOUNTER — Encounter (INDEPENDENT_AMBULATORY_CARE_PROVIDER_SITE_OTHER): Payer: Self-pay | Admitting: *Deleted

## 2012-07-07 DIAGNOSIS — K5120 Ulcerative (chronic) proctitis without complications: Secondary | ICD-10-CM

## 2012-08-31 ENCOUNTER — Other Ambulatory Visit: Payer: Self-pay | Admitting: Adult Health

## 2012-08-31 ENCOUNTER — Other Ambulatory Visit (HOSPITAL_COMMUNITY)
Admission: RE | Admit: 2012-08-31 | Discharge: 2012-08-31 | Disposition: A | Discharge status: Home / Self Care | Payer: BC Managed Care – PPO | Source: Ambulatory Visit | Attending: Obstetrics and Gynecology | Admitting: Obstetrics and Gynecology

## 2012-08-31 DIAGNOSIS — Z01419 Encounter for gynecological examination (general) (routine) without abnormal findings: Secondary | ICD-10-CM | POA: Insufficient documentation

## 2012-08-31 DIAGNOSIS — Z1151 Encounter for screening for human papillomavirus (HPV): Secondary | ICD-10-CM | POA: Insufficient documentation

## 2012-08-31 LAB — CBC WITH DIFFERENTIAL/PLATELET
Basophils Relative: 0 % (ref 0–1)
Hemoglobin: 13.6 g/dL (ref 12.0–15.0)
Lymphs Abs: 0.9 10*3/uL (ref 0.7–4.0)
Monocytes Relative: 14 % — ABNORMAL HIGH (ref 3–12)
Neutro Abs: 4 10*3/uL (ref 1.7–7.7)
Neutrophils Relative %: 69 % (ref 43–77)
RBC: 4.07 MIL/uL (ref 3.87–5.11)
WBC: 5.8 10*3/uL (ref 4.0–10.5)

## 2012-09-02 ENCOUNTER — Telehealth (INDEPENDENT_AMBULATORY_CARE_PROVIDER_SITE_OTHER): Payer: Self-pay | Admitting: *Deleted

## 2012-09-02 DIAGNOSIS — K5120 Ulcerative (chronic) proctitis without complications: Secondary | ICD-10-CM

## 2012-09-02 NOTE — Telephone Encounter (Signed)
Lab is noted for 4 months

## 2012-09-23 ENCOUNTER — Other Ambulatory Visit: Payer: Self-pay | Admitting: Adult Health

## 2012-09-23 DIAGNOSIS — N6459 Other signs and symptoms in breast: Secondary | ICD-10-CM

## 2012-11-04 ENCOUNTER — Ambulatory Visit
Admission: RE | Admit: 2012-11-04 | Discharge: 2012-11-04 | Disposition: A | Discharge status: Home / Self Care | Payer: BC Managed Care – PPO | Source: Ambulatory Visit | Attending: Adult Health | Admitting: Adult Health

## 2012-11-04 DIAGNOSIS — N6459 Other signs and symptoms in breast: Secondary | ICD-10-CM

## 2012-12-08 ENCOUNTER — Telehealth (INDEPENDENT_AMBULATORY_CARE_PROVIDER_SITE_OTHER): Payer: Self-pay | Admitting: *Deleted

## 2012-12-08 ENCOUNTER — Encounter (INDEPENDENT_AMBULATORY_CARE_PROVIDER_SITE_OTHER): Payer: Self-pay | Admitting: *Deleted

## 2012-12-08 DIAGNOSIS — K5120 Ulcerative (chronic) proctitis without complications: Secondary | ICD-10-CM

## 2012-12-08 NOTE — Telephone Encounter (Signed)
Lab order printed

## 2013-01-03 LAB — CBC WITH DIFFERENTIAL/PLATELET
Eosinophils Relative: 2 % (ref 0–5)
HCT: 40.7 % (ref 36.0–46.0)
Lymphocytes Relative: 18 % (ref 12–46)
Lymphs Abs: 0.9 10*3/uL (ref 0.7–4.0)
MCV: 95.1 fL (ref 78.0–100.0)
Platelets: 261 10*3/uL (ref 150–400)
RBC: 4.28 MIL/uL (ref 3.87–5.11)
WBC: 5 10*3/uL (ref 4.0–10.5)

## 2013-01-05 ENCOUNTER — Telehealth (INDEPENDENT_AMBULATORY_CARE_PROVIDER_SITE_OTHER): Payer: Self-pay | Admitting: *Deleted

## 2013-01-05 NOTE — Telephone Encounter (Signed)
Per Dr.Rehman the patient will need to have lab work in 4 months.

## 2013-01-05 NOTE — Telephone Encounter (Signed)
Patient called and given results Next lab in 4 months , noted for June 2014. Fax results to PCP Lelon Frohlich)

## 2013-01-25 ENCOUNTER — Telehealth (INDEPENDENT_AMBULATORY_CARE_PROVIDER_SITE_OTHER): Payer: Self-pay | Admitting: *Deleted

## 2013-01-25 NOTE — Telephone Encounter (Signed)
Per Dr.Rehman the patient will need to have CBC/D in 4 months.

## 2013-01-30 ENCOUNTER — Other Ambulatory Visit (INDEPENDENT_AMBULATORY_CARE_PROVIDER_SITE_OTHER): Payer: Self-pay | Admitting: Internal Medicine

## 2013-02-01 ENCOUNTER — Encounter (INDEPENDENT_AMBULATORY_CARE_PROVIDER_SITE_OTHER): Payer: Self-pay

## 2013-02-13 ENCOUNTER — Other Ambulatory Visit (INDEPENDENT_AMBULATORY_CARE_PROVIDER_SITE_OTHER): Payer: Self-pay | Admitting: Internal Medicine

## 2013-02-13 NOTE — Telephone Encounter (Signed)
This prescription was denied as the patient is on Lialda 1.2 G EC Tablet. I called [facility] and spoke with Jonni Sanger, her prescription for Lialda was updated for 1 year. Patient to take 2 tablets by mouth twice daily #120  With 11 refills

## 2013-03-05 ENCOUNTER — Other Ambulatory Visit (INDEPENDENT_AMBULATORY_CARE_PROVIDER_SITE_OTHER): Payer: Self-pay | Admitting: Internal Medicine

## 2013-03-31 ENCOUNTER — Other Ambulatory Visit: Payer: Self-pay

## 2013-03-31 DIAGNOSIS — Z1231 Encounter for screening mammogram for malignant neoplasm of breast: Secondary | ICD-10-CM

## 2013-04-06 ENCOUNTER — Other Ambulatory Visit (INDEPENDENT_AMBULATORY_CARE_PROVIDER_SITE_OTHER): Payer: Self-pay | Admitting: *Deleted

## 2013-04-06 ENCOUNTER — Encounter (INDEPENDENT_AMBULATORY_CARE_PROVIDER_SITE_OTHER): Payer: Self-pay | Admitting: *Deleted

## 2013-04-06 DIAGNOSIS — K5190 Ulcerative colitis, unspecified, without complications: Secondary | ICD-10-CM

## 2013-04-28 ENCOUNTER — Ambulatory Visit
Admission: RE | Admit: 2013-04-28 | Discharge: 2013-04-28 | Disposition: A | Discharge status: Home / Self Care | Payer: BC Managed Care – PPO | Source: Ambulatory Visit

## 2013-04-28 DIAGNOSIS — Z1231 Encounter for screening mammogram for malignant neoplasm of breast: Secondary | ICD-10-CM

## 2013-05-03 LAB — CBC WITH DIFFERENTIAL/PLATELET
Basophils Absolute: 0 10*3/uL (ref 0.0–0.1)
HCT: 40.8 % (ref 36.0–46.0)
Lymphocytes Relative: 15 % (ref 12–46)
Neutro Abs: 3.3 10*3/uL (ref 1.7–7.7)
Neutrophils Relative %: 68 % (ref 43–77)
Platelets: 256 10*3/uL (ref 150–400)
RDW: 14.3 % (ref 11.5–15.5)
WBC: 4.8 10*3/uL (ref 4.0–10.5)

## 2013-05-05 ENCOUNTER — Telehealth (INDEPENDENT_AMBULATORY_CARE_PROVIDER_SITE_OTHER): Payer: Self-pay | Admitting: *Deleted

## 2013-05-05 NOTE — Telephone Encounter (Signed)
I called Naida to make her aware of her recent lab results. I advised that Dr. Laural Golden was on vacation ,he will review the labs and either he or myself will call her back with recommendations. Kumari states that she has lost down to 137 pounds, at her last office visit she weight 161 lbs 11.2 oz.( 07/04/12) She says that she has been jogging, low carb diet.  Lakindra says that she feels that [place] manages her medication by her weight and wanted to let him know.

## 2013-05-10 ENCOUNTER — Telehealth (INDEPENDENT_AMBULATORY_CARE_PROVIDER_SITE_OTHER): Payer: Self-pay | Admitting: *Deleted

## 2013-05-10 DIAGNOSIS — K5190 Ulcerative colitis, unspecified, without complications: Secondary | ICD-10-CM

## 2013-05-10 NOTE — Telephone Encounter (Signed)
Per Dr.Rehman the patient will need to have lab work in 4 months, this has been noted for November. The  Patient will also need a office visit in August per Dr.Rehman/with Dr.Rehman.

## 2013-05-10 NOTE — Telephone Encounter (Signed)
CBC results reviewed with patient. Weight loss appears to be voluntary. Next CBC in four months. Patient needs office visit in August 2014 (yearly visit).

## 2013-05-10 NOTE — Telephone Encounter (Signed)
CBC is noted for November and this info was forwarded to [facility] for a office appointment in August.

## 2013-05-11 NOTE — Telephone Encounter (Signed)
Apt has been scheduled for 07/17/13 at 3:45 pm with Dr. Laural Golden.

## 2013-06-21 ENCOUNTER — Other Ambulatory Visit (INDEPENDENT_AMBULATORY_CARE_PROVIDER_SITE_OTHER): Payer: Self-pay | Admitting: Internal Medicine

## 2013-07-17 ENCOUNTER — Ambulatory Visit (INDEPENDENT_AMBULATORY_CARE_PROVIDER_SITE_OTHER): Payer: BC Managed Care – PPO | Admitting: Internal Medicine

## 2013-07-17 ENCOUNTER — Encounter (INDEPENDENT_AMBULATORY_CARE_PROVIDER_SITE_OTHER): Payer: Self-pay | Admitting: Internal Medicine

## 2013-07-17 VITALS — BP 110/68 | HR 74 | Temp 97.2°F | Resp 18 | Ht 63.0 in | Wt 138.5 lb

## 2013-07-17 DIAGNOSIS — K5190 Ulcerative colitis, unspecified, without complications: Secondary | ICD-10-CM

## 2013-07-17 DIAGNOSIS — E785 Hyperlipidemia, unspecified: Secondary | ICD-10-CM

## 2013-07-17 NOTE — Patient Instructions (Signed)
Physician will contact with results of blood work.

## 2013-07-17 NOTE — Progress Notes (Signed)
Presenting complaint;  Followup for UC.  Subjective:  Patient is 47 year old Caucasian female who has history of chronic ulcerative colitis and is here for yearly visit. Her condition was diagnosed in June 2006. She feels well. Bowels move every other day or every 3 days. She denies diarrhea melena or rectal bleeding. She also denies abdominal pain. She has good appetite. She has lost 22 pounds in the last one year one. She jogs 5 miles a day on most days. She saw Dr. Gerarda Fraction 2 weeks ago who recommended blood work which you were delayed until today she can get all of it done at the same time. She is not having problems with current dose of Crestor.  Current Medications: Current Outpatient Prescriptions  Medication Sig Dispense Refill  . Calcium Carbonate-Vitamin D (CALCIUM + D PO) Take by mouth daily.      . CRESTOR 20 MG tablet TAKE ONE (1) TABLET EACH DAY  30 tablet  5  . LIALDA 1.2 G EC tablet TAKE TWO TABLETS BY MOUTH TWICE DAILY  120 each  11  . meclizine (ANTIVERT) 25 MG tablet Take 25 mg by mouth as needed.      . medroxyPROGESTERone (DEPO-PROVERA) 150 MG/ML injection Inject 150 mg into the muscle every 3 (three) months. Last injection was June 19 th , 2013      . mercaptopurine (PURINETHOL) 50 MG tablet TAKE ONE (1) TABLET BY MOUTH EVERY DAY  30 tablet  5  . Pediatric Multivit-Minerals-C (KIDS GUMMY BEAR VITAMINS PO) Take by mouth daily.       No current facility-administered medications for this visit.     Objective: Blood pressure 110/68, pulse 74, temperature 97.2 F (36.2 C), temperature source Oral, resp. rate 18, height 5' 3"  (1.6 m), weight 138 lb 8 oz (62.823 kg). Patient is alert and in no acute distress. Conjunctiva is pink. Sclera is nonicteric Oropharyngeal mucosa is normal. No neck masses or thyromegaly noted. Cardiac exam with regular rhythm normal S1 and S2. No murmur or gallop noted. Lungs are clear to auscultation. Abdomen is flat and soft without organomegaly  masses or tenderness.  No LE edema or clubbing noted.  Labs/studies Results: CBC from 05/03/2013. WBC 4.8, H&H 14 and 40.8 and platelet count 256K.   Assessment:  #1. Chronic ulcerative colitis. She was diagnosed with pancolitis in June 2006. She has required two medications but has been in remission for about 4 years. #2. History of elevated transaminases secondary to statin but she is tolerating low dose of medication.    Plan:  Patient will go to the lab for fasting blood work to include CBC with differential, comprehensive chemistry panel and lipid panel. Copy of lab will be sent to Dr. Gerarda Fraction. Office visit in one year.

## 2013-08-31 ENCOUNTER — Other Ambulatory Visit (INDEPENDENT_AMBULATORY_CARE_PROVIDER_SITE_OTHER): Payer: Self-pay | Admitting: *Deleted

## 2013-08-31 ENCOUNTER — Encounter (INDEPENDENT_AMBULATORY_CARE_PROVIDER_SITE_OTHER): Payer: Self-pay | Admitting: *Deleted

## 2013-08-31 DIAGNOSIS — K5190 Ulcerative colitis, unspecified, without complications: Secondary | ICD-10-CM

## 2013-09-12 ENCOUNTER — Encounter: Payer: Self-pay | Admitting: Adult Health

## 2013-09-12 ENCOUNTER — Ambulatory Visit (INDEPENDENT_AMBULATORY_CARE_PROVIDER_SITE_OTHER): Payer: BC Managed Care – PPO | Admitting: Adult Health

## 2013-09-12 VITALS — BP 112/68 | HR 68 | Ht 63.0 in | Wt 146.0 lb

## 2013-09-12 DIAGNOSIS — J029 Acute pharyngitis, unspecified: Secondary | ICD-10-CM

## 2013-09-12 DIAGNOSIS — Z309 Encounter for contraceptive management, unspecified: Secondary | ICD-10-CM | POA: Insufficient documentation

## 2013-09-12 DIAGNOSIS — Z1212 Encounter for screening for malignant neoplasm of rectum: Secondary | ICD-10-CM

## 2013-09-12 DIAGNOSIS — Z01419 Encounter for gynecological examination (general) (routine) without abnormal findings: Secondary | ICD-10-CM

## 2013-09-12 HISTORY — DX: Encounter for contraceptive management, unspecified: Z30.9

## 2013-09-12 LAB — CBC WITH DIFFERENTIAL/PLATELET
Basophils Absolute: 0 10*3/uL (ref 0.0–0.1)
Basophils Relative: 0 % (ref 0–1)
Eosinophils Absolute: 0.1 10*3/uL (ref 0.0–0.7)
Hemoglobin: 13.6 g/dL (ref 12.0–15.0)
MCH: 33 pg (ref 26.0–34.0)
MCHC: 33.4 g/dL (ref 30.0–36.0)
Monocytes Relative: 18 % — ABNORMAL HIGH (ref 3–12)
Neutro Abs: 4.2 10*3/uL (ref 1.7–7.7)
Neutrophils Relative %: 75 % (ref 43–77)
Platelets: 277 10*3/uL (ref 150–400)

## 2013-09-12 LAB — HEMOCCULT GUIAC POC 1CARD (OFFICE): Fecal Occult Blood, POC: NEGATIVE

## 2013-09-12 MED ORDER — MEDROXYPROGESTERONE ACETATE 150 MG/ML IM SUSP: 150.0000 mg | INTRAMUSCULAR | Status: DC

## 2013-09-12 MED ORDER — AZITHROMYCIN 250 MG PO TABS: 250.0000 mg | ORAL_TABLET | Freq: Every day | ORAL | Status: DC

## 2013-09-12 NOTE — Progress Notes (Signed)
Patient ID: Sandra Crane, female   DOB: 03-05-1966, 47 y.o.   MRN: [id number] History of Present Illness: Sandra Crane is a 47 year old white female married in for physical.Had normal pap and negative HPV in 08/2012.Runs 5 miles a day.Complains of sore throat.Happy with depo. Had labs today for Dr Laural Golden.  Current Medications, Allergies, Past Medical History, Past Surgical History, Family History and Social History were reviewed in [employer] record.   Past Medical History  Diagnosis Date  . Ulcerative colitis   . Elevated cholesterol   . Contraceptive management 09/12/2013  Current outpatient prescriptions:Calcium Carbonate-Vitamin D (CALCIUM + D PO), Take by mouth daily., Disp: , Rfl: ;  LIALDA 1.2 G EC tablet, TAKE TWO TABLETS BY MOUTH TWICE DAILY, Disp: 120 each, Rfl: 11;  medroxyPROGESTERone (DEPO-PROVERA) 150 MG/ML injection, Inject 1 mL (150 mg total) into the muscle every 3 (three) months. Last injection was June 19 th , 2013, Disp: 1 mL, Rfl: 4 mercaptopurine (PURINETHOL) 50 MG tablet, TAKE ONE (1) TABLET BY MOUTH EVERY DAY, Disp: 30 tablet, Rfl: 5;  Pediatric Multivit-Minerals-C (KIDS GUMMY BEAR VITAMINS PO), Take by mouth daily., Disp: , Rfl: ;  rosuvastatin (CRESTOR) 20 MG tablet, , Disp: , Rfl: ;  azithromycin (ZITHROMAX) 250 MG tablet, Take 1 tablet (250 mg total) by mouth daily., Disp: 6 tablet, Rfl: 0;  meclizine (ANTIVERT) 25 MG tablet, Take 25 mg by mouth as needed., Disp: , Rfl:  Past Surgical History  Procedure Laterality Date  . Cesarean section  [phone]  . Colonoscopy    . Sigmoidoscopy  07/19/2009  . Wisdom tooth extraction     Review of Systems: Patient denies any headaches, blurred vision, shortness of breath, chest pain, abdominal pain, problems with bowel movements, urination, or intercourse. No joint pain or mood swings    Physical Exam:BP 112/68  Pulse 68  Ht 5' 3"  (1.6 m)  Wt 146 lb (66.225 kg)  BMI 25.87 kg/m2 General:  Well  developed, well nourished, no acute distress Skin:  Warm and dry Neck:  Midline trachea, normal thyroid,throat red with pustule right tonsil. Lungs; Clear to auscultation bilaterally Breast:  No dominant palpable mass, retraction, or nipple discharge Cardiovascular: Regular rate and rhythm Abdomen:  Soft, non tender, no hepatosplenomegaly Pelvic:  External genitalia is normal in appearance.  The vagina is normal in appearance. The cervix is smooth.  Uterus is felt to be normal size, shape, and contour.  No  adnexal masses or tenderness noted. Rectal: Good sphincter tone, no polyps, or hemorrhoids felt.  Hemoccult negative. Extremities:  No swelling or varicosities noted Psych:  No mood changes, alert and cooperative.   Impression: Yearly gyn exam no pap Contraceptive management Sore throat    Plan: Physical in 1 year Mammogram yearly Colonoscopy per Dr Laural Golden Rx azithromycin 250 mg # 6 2 now then 1 daily x 4 days Refilled depo provera x 4

## 2013-09-12 NOTE — Patient Instructions (Signed)
Physical in 1 year Mammogram yearly Colonoscopy per GI

## 2013-09-13 LAB — COMPREHENSIVE METABOLIC PANEL
ALT: 19 U/L (ref 0–35)
AST: 17 U/L (ref 0–37)
Albumin: 4.2 g/dL (ref 3.5–5.2)
Alkaline Phosphatase: 45 U/L (ref 39–117)
Glucose, Bld: 83 mg/dL (ref 70–99)
Potassium: 3.5 mEq/L (ref 3.5–5.3)
Sodium: 139 mEq/L (ref 135–145)
Total Bilirubin: 0.8 mg/dL (ref 0.3–1.2)
Total Protein: 6.5 g/dL (ref 6.0–8.3)

## 2013-09-13 LAB — LIPID PANEL
LDL Cholesterol: 125 mg/dL — ABNORMAL HIGH (ref 0–99)
Triglycerides: 60 mg/dL (ref <150)
VLDL: 12 mg/dL (ref 0–40)

## 2013-09-14 ENCOUNTER — Other Ambulatory Visit: Payer: Self-pay

## 2013-09-21 ENCOUNTER — Telehealth (INDEPENDENT_AMBULATORY_CARE_PROVIDER_SITE_OTHER): Payer: Self-pay | Admitting: *Deleted

## 2013-09-21 DIAGNOSIS — K5190 Ulcerative colitis, unspecified, without complications: Secondary | ICD-10-CM

## 2013-09-21 NOTE — Telephone Encounter (Signed)
Per Dr.Rehman the patient will need to have labs drawn in 4 months.

## 2013-09-21 NOTE — Telephone Encounter (Signed)
Per Dr.Rehman the patient will need to have labs drawn in 4 months CBC/D.

## 2014-01-03 ENCOUNTER — Encounter (INDEPENDENT_AMBULATORY_CARE_PROVIDER_SITE_OTHER): Payer: Self-pay | Admitting: *Deleted

## 2014-01-03 ENCOUNTER — Other Ambulatory Visit (INDEPENDENT_AMBULATORY_CARE_PROVIDER_SITE_OTHER): Payer: Self-pay | Admitting: *Deleted

## 2014-01-03 DIAGNOSIS — K5190 Ulcerative colitis, unspecified, without complications: Secondary | ICD-10-CM

## 2014-01-23 LAB — CBC WITH DIFFERENTIAL/PLATELET
Basophils Absolute: 0.1 10*3/uL (ref 0.0–0.1)
Basophils Relative: 1 % (ref 0–1)
EOS ABS: 0.1 10*3/uL (ref 0.0–0.7)
Eosinophils Relative: 1 % (ref 0–5)
HEMATOCRIT: 40.2 % (ref 36.0–46.0)
Hemoglobin: 13.6 g/dL (ref 12.0–15.0)
LYMPHS PCT: 16 % (ref 12–46)
Lymphs Abs: 0.9 10*3/uL (ref 0.7–4.0)
MCH: 32.9 pg (ref 26.0–34.0)
MCHC: 33.8 g/dL (ref 30.0–36.0)
MCV: 97.1 fL (ref 78.0–100.0)
MONOS PCT: 17 % — AB (ref 3–12)
Monocytes Absolute: 1 10*3/uL (ref 0.1–1.0)
Neutro Abs: 3.8 10*3/uL (ref 1.7–7.7)
Neutrophils Relative %: 65 % (ref 43–77)
Platelets: 249 10*3/uL (ref 150–400)
RBC: 4.14 MIL/uL (ref 3.87–5.11)
RDW: 13.9 % (ref 11.5–15.5)
WBC: 5.8 10*3/uL (ref 4.0–10.5)

## 2014-01-26 ENCOUNTER — Telehealth (INDEPENDENT_AMBULATORY_CARE_PROVIDER_SITE_OTHER): Payer: Self-pay | Admitting: *Deleted

## 2014-01-26 DIAGNOSIS — K5190 Ulcerative colitis, unspecified, without complications: Secondary | ICD-10-CM

## 2014-01-26 NOTE — Telephone Encounter (Signed)
Per Dr.Rehman the patient will need to have labs drawn in 4 months.

## 2014-03-31 ENCOUNTER — Other Ambulatory Visit (INDEPENDENT_AMBULATORY_CARE_PROVIDER_SITE_OTHER): Payer: Self-pay | Admitting: Internal Medicine

## 2014-04-03 ENCOUNTER — Other Ambulatory Visit (INDEPENDENT_AMBULATORY_CARE_PROVIDER_SITE_OTHER): Payer: Self-pay | Admitting: Internal Medicine

## 2014-04-03 DIAGNOSIS — K5190 Ulcerative colitis, unspecified, without complications: Secondary | ICD-10-CM

## 2014-04-03 MED ORDER — MESALAMINE 1.2 G PO TBEC: DELAYED_RELEASE_TABLET | ORAL | Status: DC

## 2014-04-09 ENCOUNTER — Other Ambulatory Visit: Payer: Self-pay

## 2014-04-09 DIAGNOSIS — Z1231 Encounter for screening mammogram for malignant neoplasm of breast: Secondary | ICD-10-CM

## 2014-04-23 ENCOUNTER — Encounter (INDEPENDENT_AMBULATORY_CARE_PROVIDER_SITE_OTHER): Payer: Self-pay | Admitting: *Deleted

## 2014-04-25 ENCOUNTER — Other Ambulatory Visit (INDEPENDENT_AMBULATORY_CARE_PROVIDER_SITE_OTHER): Payer: Self-pay | Admitting: *Deleted

## 2014-04-25 ENCOUNTER — Encounter (INDEPENDENT_AMBULATORY_CARE_PROVIDER_SITE_OTHER): Payer: Self-pay | Admitting: *Deleted

## 2014-04-25 DIAGNOSIS — K5190 Ulcerative colitis, unspecified, without complications: Secondary | ICD-10-CM

## 2014-04-30 ENCOUNTER — Ambulatory Visit (INDEPENDENT_AMBULATORY_CARE_PROVIDER_SITE_OTHER): Payer: BC Managed Care – PPO | Admitting: Internal Medicine

## 2014-05-01 ENCOUNTER — Ambulatory Visit
Admission: RE | Admit: 2014-05-01 | Discharge: 2014-05-01 | Disposition: A | Discharge status: Home / Self Care | Payer: BC Managed Care – PPO | Source: Ambulatory Visit

## 2014-05-01 DIAGNOSIS — Z1231 Encounter for screening mammogram for malignant neoplasm of breast: Secondary | ICD-10-CM

## 2014-05-08 ENCOUNTER — Other Ambulatory Visit (INDEPENDENT_AMBULATORY_CARE_PROVIDER_SITE_OTHER): Payer: Self-pay | Admitting: Internal Medicine

## 2014-05-08 NOTE — Telephone Encounter (Signed)
Per Dr.Rehman may fill with 5 additional refills.

## 2014-06-04 LAB — CBC WITH DIFFERENTIAL/PLATELET
BASOS PCT: 1 % (ref 0–1)
Basophils Absolute: 0.1 10*3/uL (ref 0.0–0.1)
EOS PCT: 1 % (ref 0–5)
Eosinophils Absolute: 0.1 10*3/uL (ref 0.0–0.7)
HEMATOCRIT: 35.7 % — AB (ref 36.0–46.0)
HEMOGLOBIN: 12.2 g/dL (ref 12.0–15.0)
Lymphocytes Relative: 15 % (ref 12–46)
Lymphs Abs: 0.8 10*3/uL (ref 0.7–4.0)
MCH: 33.1 pg (ref 26.0–34.0)
MCHC: 34.2 g/dL (ref 30.0–36.0)
MCV: 96.7 fL (ref 78.0–100.0)
MONO ABS: 0.8 10*3/uL (ref 0.1–1.0)
Monocytes Relative: 15 % — ABNORMAL HIGH (ref 3–12)
NEUTROS ABS: 3.6 10*3/uL (ref 1.7–7.7)
Neutrophils Relative %: 68 % (ref 43–77)
Platelets: 305 10*3/uL (ref 150–400)
RBC: 3.69 MIL/uL — ABNORMAL LOW (ref 3.87–5.11)
RDW: 13.8 % (ref 11.5–15.5)
WBC: 5.3 10*3/uL (ref 4.0–10.5)

## 2014-06-05 LAB — COMPREHENSIVE METABOLIC PANEL
ALK PHOS: 41 U/L (ref 39–117)
ALT: 52 U/L — ABNORMAL HIGH (ref 0–35)
AST: 39 U/L — ABNORMAL HIGH (ref 0–37)
Albumin: 4.6 g/dL (ref 3.5–5.2)
BUN: 14 mg/dL (ref 6–23)
CALCIUM: 9.3 mg/dL (ref 8.4–10.5)
CO2: 24 mEq/L (ref 19–32)
Chloride: 105 mEq/L (ref 96–112)
Creat: 0.61 mg/dL (ref 0.50–1.10)
GLUCOSE: 91 mg/dL (ref 70–99)
Potassium: 3.8 mEq/L (ref 3.5–5.3)
Sodium: 137 mEq/L (ref 135–145)
Total Bilirubin: 0.7 mg/dL (ref 0.2–1.2)
Total Protein: 7 g/dL (ref 6.0–8.3)

## 2014-06-11 ENCOUNTER — Other Ambulatory Visit (INDEPENDENT_AMBULATORY_CARE_PROVIDER_SITE_OTHER): Payer: Self-pay | Admitting: *Deleted

## 2014-06-11 ENCOUNTER — Encounter (INDEPENDENT_AMBULATORY_CARE_PROVIDER_SITE_OTHER): Payer: Self-pay | Admitting: *Deleted

## 2014-06-11 ENCOUNTER — Telehealth (INDEPENDENT_AMBULATORY_CARE_PROVIDER_SITE_OTHER): Payer: Self-pay | Admitting: *Deleted

## 2014-06-11 DIAGNOSIS — K5190 Ulcerative colitis, unspecified, without complications: Secondary | ICD-10-CM

## 2014-06-11 NOTE — Telephone Encounter (Signed)
Per Dr.Rehman the patient will need to have labs drawn in 4 weeks.

## 2014-07-10 LAB — HEPATIC FUNCTION PANEL
ALT: 9 U/L (ref 0–35)
AST: 14 U/L (ref 0–37)
Albumin: 4.2 g/dL (ref 3.5–5.2)
Alkaline Phosphatase: 43 U/L (ref 39–117)
Bilirubin, Direct: 0.1 mg/dL (ref 0.0–0.3)
Indirect Bilirubin: 0.5 mg/dL (ref 0.2–1.2)
TOTAL PROTEIN: 6.6 g/dL (ref 6.0–8.3)
Total Bilirubin: 0.6 mg/dL (ref 0.2–1.2)

## 2014-07-18 ENCOUNTER — Telehealth (INDEPENDENT_AMBULATORY_CARE_PROVIDER_SITE_OTHER): Payer: Self-pay | Admitting: *Deleted

## 2014-07-18 DIAGNOSIS — K5190 Ulcerative colitis, unspecified, without complications: Secondary | ICD-10-CM

## 2014-07-18 NOTE — Telephone Encounter (Signed)
Per Dr.Rehman the patient will need to have labs drawn in 4 months.

## 2014-08-28 ENCOUNTER — Ambulatory Visit (INDEPENDENT_AMBULATORY_CARE_PROVIDER_SITE_OTHER): Payer: BC Managed Care – PPO | Admitting: Internal Medicine

## 2014-08-28 ENCOUNTER — Encounter (INDEPENDENT_AMBULATORY_CARE_PROVIDER_SITE_OTHER): Payer: Self-pay | Admitting: Internal Medicine

## 2014-08-28 VITALS — BP 110/70 | HR 72 | Temp 98.3°F | Resp 18 | Ht 63.0 in | Wt 139.6 lb

## 2014-08-28 DIAGNOSIS — E785 Hyperlipidemia, unspecified: Secondary | ICD-10-CM

## 2014-08-28 DIAGNOSIS — K5190 Ulcerative colitis, unspecified, without complications: Secondary | ICD-10-CM

## 2014-08-28 DIAGNOSIS — R7989 Other specified abnormal findings of blood chemistry: Secondary | ICD-10-CM | POA: Insufficient documentation

## 2014-08-28 DIAGNOSIS — R945 Abnormal results of liver function studies: Secondary | ICD-10-CM | POA: Insufficient documentation

## 2014-08-28 NOTE — Progress Notes (Signed)
Presenting complaint;  Followup for ulcerative colitis.  Subjective:  Sandra Crane is 48 year old Caucasian female with 9 year history of ulcerative colitis who is here for her yearly visit. She has no complaints. She has a 1-2 formed stools daily. Every now and then she may skip a day and occasionally 2 days. She denies abdominal pain melena rectal bleeding nausea or vomiting. She is having no side effects with 6-MP. She had blood work in July of this year and transaminases are elevated. She was advised to discontinue Crestor. LFTs returned to normal on 07/09/2014.   Current Medications: Outpatient Encounter Prescriptions as of 08/28/2014  Medication Sig  . Calcium Carbonate-Vitamin D (CALCIUM + D PO) Take by mouth daily.  . medroxyPROGESTERone (DEPO-PROVERA) 150 MG/ML injection Inject 1 mL (150 mg total) into the muscle every 3 (three) months. Last injection was June 19 th , 2013  . mercaptopurine (PURINETHOL) 50 MG tablet TAKE ONE TABLET BY MOUTH ONCE DAILY  . mesalamine (LIALDA) 1.2 G EC tablet TAKE TWO TABLETS BY MOUTH TWICE DAILY  . Pediatric Multivit-Minerals-C (KIDS GUMMY BEAR VITAMINS PO) Take by mouth daily.  . [DISCONTINUED] azithromycin (ZITHROMAX) 250 MG tablet Take 1 tablet (250 mg total) by mouth daily.  . [DISCONTINUED] meclizine (ANTIVERT) 25 MG tablet Take 25 mg by mouth as needed.  . [DISCONTINUED] rosuvastatin (CRESTOR) 20 MG tablet      Objective: Blood pressure 110/70, pulse 72, temperature 98.3 F (36.8 C), temperature source Oral, resp. rate 18, height 5' 3"  (1.6 m), weight 139 lb 9.6 oz (63.322 kg). Patient is alert and in no acute distress. Conjunctiva is pink. Sclera is nonicteric Oropharyngeal mucosa is normal. No neck masses or thyromegaly noted. Cardiac exam with regular rhythm normal S1 and S2. No murmur or gallop noted. Lungs are clear to auscultation. Abdomen is soft and nontender without organomegaly or masses.  No LE edema or clubbing  noted.  Labs/studies Results: AST and ALT were 39 and 52 on 06/04/2014  AST and ALT were 14 and 19 on 07/09/2014   CBC on 06/04/2014 as follows; WBC 5.3 H&H 12.2 and 35.7 and platelet count 305K.  Assessment:  #1. Chronic ulcerative colitis. Condition diagnosed in June 2006. She remains in remission. #2. History of mildly elevated transaminases secondary to Crestor. Transaminases return to normal on stopping medication. #3. History of hyperlipidemia.   Plan:  Continue 6-MP at 50 mg by mouth daily. Continue Lialda at 2.4 g by mouth twice a day. CBC with differential, LFTs metabolic 7 and likely profile in December 2015. Office visit in one year. Colonoscopy next year following her visit.

## 2014-08-28 NOTE — Patient Instructions (Signed)
Next blood work will be in December 2015.

## 2014-08-29 ENCOUNTER — Encounter (INDEPENDENT_AMBULATORY_CARE_PROVIDER_SITE_OTHER): Payer: Self-pay | Admitting: Internal Medicine

## 2014-09-10 ENCOUNTER — Encounter (INDEPENDENT_AMBULATORY_CARE_PROVIDER_SITE_OTHER): Payer: Self-pay | Admitting: Internal Medicine

## 2014-09-17 ENCOUNTER — Ambulatory Visit (INDEPENDENT_AMBULATORY_CARE_PROVIDER_SITE_OTHER): Payer: BC Managed Care – PPO | Admitting: Adult Health

## 2014-09-17 ENCOUNTER — Encounter: Payer: Self-pay | Admitting: Adult Health

## 2014-09-17 VITALS — BP 110/78 | HR 76 | Ht 62.25 in | Wt 140.5 lb

## 2014-09-17 DIAGNOSIS — Z1212 Encounter for screening for malignant neoplasm of rectum: Secondary | ICD-10-CM

## 2014-09-17 DIAGNOSIS — Z01419 Encounter for gynecological examination (general) (routine) without abnormal findings: Secondary | ICD-10-CM

## 2014-09-17 DIAGNOSIS — Z3042 Encounter for surveillance of injectable contraceptive: Secondary | ICD-10-CM

## 2014-09-17 LAB — HEMOCCULT GUIAC POC 1CARD (OFFICE): Fecal Occult Blood, POC: NEGATIVE

## 2014-09-17 MED ORDER — MEDROXYPROGESTERONE ACETATE 150 MG/ML IM SUSP: INTRAMUSCULAR | Status: DC

## 2014-09-17 NOTE — Progress Notes (Signed)
Patient ID: Sandra Crane, female   DOB: 1966/07/01, 48 y.o.   MRN: [id number] History of Present Illness: Sandra Crane is a 48 year old white female,married in for gyn physical. She had a normal pap with negative HPV on 08/31/12.   Current Medications, Allergies, Past Medical History, Past Surgical History, Family History and Social History were reviewed in [employer] record.     Review of Systems: Patient denies any headaches, blurred vision, shortness of breath, chest pain, abdominal pain, problems with bowel movements, urination, or intercourse. No joint pain or mood swings.Loves depo,not having periods.Occasional hot flash.She is still running. UC doing well.   Physical Exam:BP 110/78 mmHg  Ht 5' 2.25" (1.581 m)  Wt 140 lb 8 oz (63.73 kg)  BMI 25.50 kg/m2 General:  Well developed, well nourished, no acute distress Skin:  Warm and dry Neck:  Midline trachea, normal thyroid Lungs; Clear to auscultation bilaterally Breast:  No dominant palpable mass, retraction, or nipple discharge Cardiovascular: Regular rate and rhythm Abdomen:  Soft, non tender, no hepatosplenomegaly Pelvic:  External genitalia is normal in appearance.  The vagina is normal in appearance.     The cervix is smooth.  Uterus is felt to be normal size, shape, and contour.  No  adnexal masses or tenderness noted. Rectal: Good sphincter tone, no polyps, or hemorrhoids felt.  Hemoccult negative.Mild rectocele Extremities:  No swelling or varicosities noted Psych:  No mood changes,alert and cooperative,seems happy   Impression: Well woman gyn exam no pap Contraceptive management    Plan: Pap and physical in 1 year Mammogram yearly Colonoscopy and labs per Dr Laural Golden Refilled depo provera x 1 year

## 2014-09-17 NOTE — Patient Instructions (Signed)
Pap and physical in 1 year Mammogram yearly  Labs and colonoscopy per Dr Laural Golden

## 2014-09-26 ENCOUNTER — Encounter (INDEPENDENT_AMBULATORY_CARE_PROVIDER_SITE_OTHER): Payer: Self-pay | Admitting: *Deleted

## 2014-09-26 ENCOUNTER — Other Ambulatory Visit (INDEPENDENT_AMBULATORY_CARE_PROVIDER_SITE_OTHER): Payer: Self-pay | Admitting: *Deleted

## 2014-09-26 DIAGNOSIS — E785 Hyperlipidemia, unspecified: Secondary | ICD-10-CM

## 2014-09-26 DIAGNOSIS — K5190 Ulcerative colitis, unspecified, without complications: Secondary | ICD-10-CM

## 2014-10-29 ENCOUNTER — Telehealth: Payer: Self-pay | Admitting: Adult Health

## 2014-10-29 LAB — CBC WITH DIFFERENTIAL/PLATELET
Basophils Absolute: 0 10*3/uL (ref 0.0–0.1)
Basophils Relative: 0 % (ref 0–1)
Eosinophils Absolute: 0 10*3/uL (ref 0.0–0.7)
Eosinophils Relative: 1 % (ref 0–5)
HEMATOCRIT: 39.2 % (ref 36.0–46.0)
Hemoglobin: 13.1 g/dL (ref 12.0–15.0)
Lymphocytes Relative: 17 % (ref 12–46)
Lymphs Abs: 0.6 10*3/uL — ABNORMAL LOW (ref 0.7–4.0)
MCH: 32.2 pg (ref 26.0–34.0)
MCHC: 33.4 g/dL (ref 30.0–36.0)
MCV: 96.3 fL (ref 78.0–100.0)
MONOS PCT: 17 % — AB (ref 3–12)
MPV: 9.3 fL — AB (ref 9.4–12.4)
Monocytes Absolute: 0.6 10*3/uL (ref 0.1–1.0)
NEUTROS ABS: 2.3 10*3/uL (ref 1.7–7.7)
Neutrophils Relative %: 65 % (ref 43–77)
Platelets: 269 10*3/uL (ref 150–400)
RBC: 4.07 MIL/uL (ref 3.87–5.11)
RDW: 14.3 % (ref 11.5–15.5)
WBC: 3.5 10*3/uL — AB (ref 4.0–10.5)

## 2014-10-29 LAB — BASIC METABOLIC PANEL
BUN: 12 mg/dL (ref 6–23)
CO2: 26 meq/L (ref 19–32)
Calcium: 8.9 mg/dL (ref 8.4–10.5)
Chloride: 108 mEq/L (ref 96–112)
Creat: 0.61 mg/dL (ref 0.50–1.10)
Glucose, Bld: 100 mg/dL — ABNORMAL HIGH (ref 70–99)
Potassium: 4.3 mEq/L (ref 3.5–5.3)
SODIUM: 141 meq/L (ref 135–145)

## 2014-10-29 NOTE — Telephone Encounter (Signed)
Having spotting with depo, when shot due has never done before and has been on for 16 years has some cramps too, she got her shot yesterday..if it continues call and will get [place] to evaluate.

## 2014-10-29 NOTE — Telephone Encounter (Signed)
I called pt but she was wanting to talk to you. Thanks!! [place]

## 2014-10-30 LAB — LIPID PANEL
CHOL/HDL RATIO: 4.9 ratio
Cholesterol: 256 mg/dL — ABNORMAL HIGH (ref 0–200)
HDL: 52 mg/dL (ref >39)
LDL CALC: 183 mg/dL — AB (ref 0–99)
Triglycerides: 107 mg/dL (ref <150)
VLDL: 21 mg/dL (ref 0–40)

## 2014-10-30 LAB — HEPATIC FUNCTION PANEL
ALK PHOS: 42 U/L (ref 39–117)
ALT: 14 U/L (ref 0–35)
AST: 14 U/L (ref 0–37)
Albumin: 4.2 g/dL (ref 3.5–5.2)
BILIRUBIN INDIRECT: 0.7 mg/dL (ref 0.2–1.2)
BILIRUBIN TOTAL: 0.8 mg/dL (ref 0.2–1.2)
Bilirubin, Direct: 0.1 mg/dL (ref 0.0–0.3)
Total Protein: 6.3 g/dL (ref 6.0–8.3)

## 2014-11-05 ENCOUNTER — Telehealth (INDEPENDENT_AMBULATORY_CARE_PROVIDER_SITE_OTHER): Payer: Self-pay | Admitting: *Deleted

## 2014-11-05 DIAGNOSIS — R7989 Other specified abnormal findings of blood chemistry: Secondary | ICD-10-CM

## 2014-11-05 DIAGNOSIS — K5190 Ulcerative colitis, unspecified, without complications: Secondary | ICD-10-CM

## 2014-11-05 DIAGNOSIS — R945 Abnormal results of liver function studies: Secondary | ICD-10-CM

## 2014-11-05 DIAGNOSIS — E785 Hyperlipidemia, unspecified: Secondary | ICD-10-CM

## 2014-11-05 NOTE — Telephone Encounter (Signed)
Per Dr.Rehman the patient will need to have labs drawn in 1 month , CBC/Diff/ Patient will have labs drawn in 4 months , CBC/Diff, Hepatic Profile.

## 2014-11-06 ENCOUNTER — Encounter (INDEPENDENT_AMBULATORY_CARE_PROVIDER_SITE_OTHER): Payer: Self-pay | Admitting: *Deleted

## 2014-11-06 ENCOUNTER — Other Ambulatory Visit (INDEPENDENT_AMBULATORY_CARE_PROVIDER_SITE_OTHER): Payer: Self-pay | Admitting: *Deleted

## 2014-11-06 DIAGNOSIS — E785 Hyperlipidemia, unspecified: Secondary | ICD-10-CM

## 2014-11-06 DIAGNOSIS — K5190 Ulcerative colitis, unspecified, without complications: Secondary | ICD-10-CM

## 2014-11-06 DIAGNOSIS — R945 Abnormal results of liver function studies: Secondary | ICD-10-CM

## 2014-11-06 DIAGNOSIS — R7989 Other specified abnormal findings of blood chemistry: Secondary | ICD-10-CM

## 2014-12-06 ENCOUNTER — Other Ambulatory Visit (INDEPENDENT_AMBULATORY_CARE_PROVIDER_SITE_OTHER): Payer: Self-pay | Admitting: Internal Medicine

## 2014-12-11 LAB — CBC WITH DIFFERENTIAL/PLATELET
BASOS ABS: 0.1 10*3/uL (ref 0.0–0.1)
Basophils Relative: 1 % (ref 0–1)
Eosinophils Absolute: 0 10*3/uL (ref 0.0–0.7)
Eosinophils Relative: 0 % (ref 0–5)
HCT: 40.9 % (ref 36.0–46.0)
Hemoglobin: 13.7 g/dL (ref 12.0–15.0)
LYMPHS ABS: 1 10*3/uL (ref 0.7–4.0)
Lymphocytes Relative: 17 % (ref 12–46)
MCH: 32.5 pg (ref 26.0–34.0)
MCHC: 33.5 g/dL (ref 30.0–36.0)
MCV: 97.1 fL (ref 78.0–100.0)
MONO ABS: 0.9 10*3/uL (ref 0.1–1.0)
MPV: 9.1 fL (ref 8.6–12.4)
Monocytes Relative: 16 % — ABNORMAL HIGH (ref 3–12)
Neutro Abs: 3.9 10*3/uL (ref 1.7–7.7)
Neutrophils Relative %: 66 % (ref 43–77)
Platelets: 323 10*3/uL (ref 150–400)
RBC: 4.21 MIL/uL (ref 3.87–5.11)
RDW: 13.9 % (ref 11.5–15.5)
WBC: 5.9 10*3/uL (ref 4.0–10.5)

## 2014-12-12 ENCOUNTER — Telehealth (INDEPENDENT_AMBULATORY_CARE_PROVIDER_SITE_OTHER): Payer: Self-pay | Admitting: *Deleted

## 2014-12-12 DIAGNOSIS — K5190 Ulcerative colitis, unspecified, without complications: Secondary | ICD-10-CM

## 2014-12-12 NOTE — Telephone Encounter (Signed)
Per Dr.Rehman the patient will need to have labs drawn in 3 months 

## 2014-12-18 ENCOUNTER — Other Ambulatory Visit (INDEPENDENT_AMBULATORY_CARE_PROVIDER_SITE_OTHER): Payer: Self-pay | Admitting: Internal Medicine

## 2015-01-07 ENCOUNTER — Other Ambulatory Visit (INDEPENDENT_AMBULATORY_CARE_PROVIDER_SITE_OTHER): Payer: Self-pay | Admitting: Internal Medicine

## 2015-01-20 ENCOUNTER — Other Ambulatory Visit (INDEPENDENT_AMBULATORY_CARE_PROVIDER_SITE_OTHER): Payer: Self-pay | Admitting: Internal Medicine

## 2015-02-06 ENCOUNTER — Encounter (INDEPENDENT_AMBULATORY_CARE_PROVIDER_SITE_OTHER): Payer: Self-pay | Admitting: *Deleted

## 2015-02-06 ENCOUNTER — Other Ambulatory Visit (INDEPENDENT_AMBULATORY_CARE_PROVIDER_SITE_OTHER): Payer: Self-pay | Admitting: *Deleted

## 2015-02-06 DIAGNOSIS — R7989 Other specified abnormal findings of blood chemistry: Secondary | ICD-10-CM

## 2015-02-06 DIAGNOSIS — K5190 Ulcerative colitis, unspecified, without complications: Secondary | ICD-10-CM

## 2015-02-06 DIAGNOSIS — E785 Hyperlipidemia, unspecified: Secondary | ICD-10-CM

## 2015-02-06 DIAGNOSIS — R945 Abnormal results of liver function studies: Secondary | ICD-10-CM

## 2015-02-08 ENCOUNTER — Other Ambulatory Visit (INDEPENDENT_AMBULATORY_CARE_PROVIDER_SITE_OTHER): Payer: Self-pay | Admitting: Internal Medicine

## 2015-02-11 ENCOUNTER — Encounter (INDEPENDENT_AMBULATORY_CARE_PROVIDER_SITE_OTHER): Payer: Self-pay | Admitting: Internal Medicine

## 2015-02-11 ENCOUNTER — Other Ambulatory Visit (INDEPENDENT_AMBULATORY_CARE_PROVIDER_SITE_OTHER): Payer: Self-pay | Admitting: Internal Medicine

## 2015-02-12 ENCOUNTER — Other Ambulatory Visit (INDEPENDENT_AMBULATORY_CARE_PROVIDER_SITE_OTHER): Payer: Self-pay | Admitting: Internal Medicine

## 2015-02-12 MED ORDER — MERCAPTOPURINE 50 MG PO TABS: 50.0000 mg | ORAL_TABLET | Freq: Every day | ORAL | Status: DC

## 2015-02-12 NOTE — Telephone Encounter (Signed)
Sandra Crane

## 2015-02-13 ENCOUNTER — Telehealth (INDEPENDENT_AMBULATORY_CARE_PROVIDER_SITE_OTHER): Payer: Self-pay | Admitting: *Deleted

## 2015-02-13 ENCOUNTER — Encounter (INDEPENDENT_AMBULATORY_CARE_PROVIDER_SITE_OTHER): Payer: Self-pay | Admitting: *Deleted

## 2015-02-13 NOTE — Telephone Encounter (Signed)
Per Dr.Rehman I called [facility]/[place] a prescription for the Crestor 5 mg - Take 1 by mouth daily #30 with 3 refills. A message was left for the patient on her voice mail , and a message was sent via Epic to her.

## 2015-03-01 ENCOUNTER — Other Ambulatory Visit (INDEPENDENT_AMBULATORY_CARE_PROVIDER_SITE_OTHER): Payer: Self-pay | Admitting: Internal Medicine

## 2015-03-06 LAB — HEPATIC FUNCTION PANEL
ALT: 13 U/L (ref 0–35)
AST: 16 U/L (ref 0–37)
Albumin: 4.2 g/dL (ref 3.5–5.2)
Alkaline Phosphatase: 54 U/L (ref 39–117)
Bilirubin, Direct: 0.1 mg/dL (ref 0.0–0.3)
Indirect Bilirubin: 0.2 mg/dL (ref 0.2–1.2)
TOTAL PROTEIN: 7.1 g/dL (ref 6.0–8.3)
Total Bilirubin: 0.3 mg/dL (ref 0.2–1.2)

## 2015-03-06 LAB — CBC WITH DIFFERENTIAL/PLATELET
BASOS PCT: 0 % (ref 0–1)
Basophils Absolute: 0 10*3/uL (ref 0.0–0.1)
EOS ABS: 0 10*3/uL (ref 0.0–0.7)
EOS PCT: 1 % (ref 0–5)
HCT: 40.5 % (ref 36.0–46.0)
Hemoglobin: 13.5 g/dL (ref 12.0–15.0)
Lymphocytes Relative: 22 % (ref 12–46)
Lymphs Abs: 1.1 10*3/uL (ref 0.7–4.0)
MCH: 32.5 pg (ref 26.0–34.0)
MCHC: 33.3 g/dL (ref 30.0–36.0)
MCV: 97.6 fL (ref 78.0–100.0)
MPV: 10 fL (ref 8.6–12.4)
Monocytes Absolute: 0.7 10*3/uL (ref 0.1–1.0)
Monocytes Relative: 14 % — ABNORMAL HIGH (ref 3–12)
Neutro Abs: 3 10*3/uL (ref 1.7–7.7)
Neutrophils Relative %: 63 % (ref 43–77)
Platelets: 253 10*3/uL (ref 150–400)
RBC: 4.15 MIL/uL (ref 3.87–5.11)
RDW: 13.5 % (ref 11.5–15.5)
WBC: 4.8 10*3/uL (ref 4.0–10.5)

## 2015-03-13 ENCOUNTER — Telehealth (INDEPENDENT_AMBULATORY_CARE_PROVIDER_SITE_OTHER): Payer: Self-pay | Admitting: *Deleted

## 2015-03-13 DIAGNOSIS — K5190 Ulcerative colitis, unspecified, without complications: Secondary | ICD-10-CM

## 2015-03-13 NOTE — Telephone Encounter (Signed)
Per Dr.Rehman the patient will need to have labs drawn in 3 months - note that the glucose is fasting.

## 2015-03-13 NOTE — Progress Notes (Signed)
Labs are noted for August 4 , 2016. Report to PCP.

## 2015-03-26 ENCOUNTER — Other Ambulatory Visit: Payer: Self-pay

## 2015-03-26 DIAGNOSIS — Z1231 Encounter for screening mammogram for malignant neoplasm of breast: Secondary | ICD-10-CM

## 2015-04-07 ENCOUNTER — Other Ambulatory Visit (INDEPENDENT_AMBULATORY_CARE_PROVIDER_SITE_OTHER): Payer: Self-pay | Admitting: Internal Medicine

## 2015-05-06 ENCOUNTER — Ambulatory Visit
Admission: RE | Admit: 2015-05-06 | Discharge: 2015-05-06 | Disposition: A | Discharge status: Home / Self Care | Payer: BC Managed Care – PPO | Source: Ambulatory Visit

## 2015-05-06 DIAGNOSIS — Z1231 Encounter for screening mammogram for malignant neoplasm of breast: Secondary | ICD-10-CM

## 2015-05-11 ENCOUNTER — Other Ambulatory Visit (INDEPENDENT_AMBULATORY_CARE_PROVIDER_SITE_OTHER): Payer: Self-pay | Admitting: Internal Medicine

## 2015-06-05 ENCOUNTER — Other Ambulatory Visit (INDEPENDENT_AMBULATORY_CARE_PROVIDER_SITE_OTHER): Payer: Self-pay | Admitting: *Deleted

## 2015-06-05 ENCOUNTER — Encounter (INDEPENDENT_AMBULATORY_CARE_PROVIDER_SITE_OTHER): Payer: Self-pay | Admitting: *Deleted

## 2015-06-05 DIAGNOSIS — K5190 Ulcerative colitis, unspecified, without complications: Secondary | ICD-10-CM

## 2015-06-12 ENCOUNTER — Encounter (INDEPENDENT_AMBULATORY_CARE_PROVIDER_SITE_OTHER): Payer: Self-pay | Admitting: *Deleted

## 2015-06-14 LAB — CBC WITH DIFFERENTIAL/PLATELET
Basophils Absolute: 0 10*3/uL (ref 0.0–0.1)
Basophils Relative: 1 % (ref 0–1)
EOS ABS: 0 10*3/uL (ref 0.0–0.7)
EOS PCT: 1 % (ref 0–5)
HCT: 39.5 % (ref 36.0–46.0)
Hemoglobin: 13.4 g/dL (ref 12.0–15.0)
LYMPHS PCT: 18 % (ref 12–46)
Lymphs Abs: 0.7 10*3/uL (ref 0.7–4.0)
MCH: 32.7 pg (ref 26.0–34.0)
MCHC: 33.9 g/dL (ref 30.0–36.0)
MCV: 96.3 fL (ref 78.0–100.0)
MONO ABS: 0.8 10*3/uL (ref 0.1–1.0)
MPV: 9.3 fL (ref 8.6–12.4)
Monocytes Relative: 20 % — ABNORMAL HIGH (ref 3–12)
NEUTROS ABS: 2.4 10*3/uL (ref 1.7–7.7)
Neutrophils Relative %: 60 % (ref 43–77)
PLATELETS: 274 10*3/uL (ref 150–400)
RBC: 4.1 MIL/uL (ref 3.87–5.11)
RDW: 13.9 % (ref 11.5–15.5)
WBC: 4 10*3/uL (ref 4.0–10.5)

## 2015-06-14 LAB — HEPATIC FUNCTION PANEL
ALBUMIN: 4.2 g/dL (ref 3.6–5.1)
ALT: 12 U/L (ref 6–29)
AST: 17 U/L (ref 10–35)
Alkaline Phosphatase: 44 U/L (ref 33–115)
Bilirubin, Direct: 0.1 mg/dL (ref <=0.2)
Indirect Bilirubin: 0.6 mg/dL (ref 0.2–1.2)
Total Bilirubin: 0.7 mg/dL (ref 0.2–1.2)
Total Protein: 6.7 g/dL (ref 6.1–8.1)

## 2015-06-14 LAB — GLUCOSE, RANDOM: Glucose, Bld: 87 mg/dL (ref 65–99)

## 2015-06-17 ENCOUNTER — Telehealth (INDEPENDENT_AMBULATORY_CARE_PROVIDER_SITE_OTHER): Payer: Self-pay | Admitting: *Deleted

## 2015-06-17 DIAGNOSIS — K5190 Ulcerative colitis, unspecified, without complications: Secondary | ICD-10-CM

## 2015-06-17 NOTE — Telephone Encounter (Signed)
Per Dr.Rehman the patient will need to have labs drawn in 4 months.

## 2015-06-25 ENCOUNTER — Other Ambulatory Visit (INDEPENDENT_AMBULATORY_CARE_PROVIDER_SITE_OTHER): Payer: Self-pay | Admitting: Internal Medicine

## 2015-08-07 ENCOUNTER — Other Ambulatory Visit (INDEPENDENT_AMBULATORY_CARE_PROVIDER_SITE_OTHER): Payer: Self-pay | Admitting: Internal Medicine

## 2015-08-29 ENCOUNTER — Ambulatory Visit (INDEPENDENT_AMBULATORY_CARE_PROVIDER_SITE_OTHER): Payer: BC Managed Care – PPO | Admitting: Internal Medicine

## 2015-09-02 ENCOUNTER — Telehealth (INDEPENDENT_AMBULATORY_CARE_PROVIDER_SITE_OTHER): Payer: Self-pay | Admitting: *Deleted

## 2015-09-02 ENCOUNTER — Ambulatory Visit (INDEPENDENT_AMBULATORY_CARE_PROVIDER_SITE_OTHER): Payer: BC Managed Care – PPO | Admitting: Internal Medicine

## 2015-09-02 ENCOUNTER — Encounter (INDEPENDENT_AMBULATORY_CARE_PROVIDER_SITE_OTHER): Payer: Self-pay | Admitting: Internal Medicine

## 2015-09-02 ENCOUNTER — Other Ambulatory Visit (INDEPENDENT_AMBULATORY_CARE_PROVIDER_SITE_OTHER): Payer: Self-pay | Admitting: Internal Medicine

## 2015-09-02 VITALS — BP 106/70 | HR 72 | Temp 98.2°F | Ht 63.0 in | Wt 158.9 lb

## 2015-09-02 DIAGNOSIS — Z1211 Encounter for screening for malignant neoplasm of colon: Secondary | ICD-10-CM | POA: Diagnosis not present

## 2015-09-02 DIAGNOSIS — K5120 Ulcerative (chronic) proctitis without complications: Secondary | ICD-10-CM

## 2015-09-02 MED ORDER — MERCAPTOPURINE 50 MG PO TABS
50.0000 mg | ORAL_TABLET | Freq: Every day | ORAL | Status: DC
Start: 2015-09-02 — End: 2016-10-11

## 2015-09-02 NOTE — Progress Notes (Addendum)
Subjective:    Patient ID: Sandra Crane, female    DOB: 03/24/1966, 49 y.o.   MRN: [id number]  HPI Here today for f/u of her UC. Hx of UC x 10 years. Here today for her yearly visit. She tells me she is doing good. There is no rectal bleeding or mucous. She is having 1-2 stools. Appetite is good. She has gained weight. She states she has not been jogging like she use to. No GI problems.  She has been taken off Crestor and now has normal liver enzymes.  04/29/2005 Ileoscopy, segmental biopsy, stool collection: Dr. Gala Romney: Inflammatory changes of the rectum and entire colon (more pronounced in the rectum and sigmoid colon) consistent with ----colitis,normal terminal ileum. Segmental biopsies and stool samples obtained    07/13/2009 Flexible sigmoid:  Diarrhea, mucous, bleeding.  Active colitis involving the sigmoid colon with rectal sparing. Multiple biopsies taken. CBC    Component Value Date/Time   WBC 4.0 06/13/2015 0752   RBC 4.10 06/13/2015 0752   HGB 13.4 06/13/2015 0752   HCT 39.5 06/13/2015 0752   PLT 274 06/13/2015 0752   MCV 96.3 06/13/2015 0752   MCH 32.7 06/13/2015 0752   MCHC 33.9 06/13/2015 0752   RDW 13.9 06/13/2015 0752   LYMPHSABS 0.7 06/13/2015 0752   MONOABS 0.8 06/13/2015 0752   EOSABS 0.0 06/13/2015 0752   BASOSABS 0.0 06/13/2015 0752   Hepatic Function Latest Ref Rng 06/13/2015 03/05/2015 10/29/2014  Total Protein 6.1 - 8.1 g/dL 6.7 7.1 6.3  Albumin 3.6 - 5.1 g/dL 4.2 4.2 4.2  AST 10 - 35 U/L _0 ALT 6 - 29 U/L _1 Alk Phosphatase 33 - 115 U/L 44 54 42  Total Bilirubin 0.2 - 1.2 mg/dL 0.7 0.3 0.8  Bilirubin, Direct <=0.2 mg/dL 0.1 0.1 0.1       Review of Systems Past Medical History  Diagnosis Date  . Ulcerative colitis   . Elevated cholesterol   . Contraceptive management 09/12/2013    Past Surgical History  Procedure Laterality Date  . Cesarean section  [phone]  . Colonoscopy    . Sigmoidoscopy  07/19/2009  . Wisdom  tooth extraction    . Root canal with crown      No Known Allergies  Current Outpatient Prescriptions on File Prior to Visit  Medication Sig Dispense Refill  . CRESTOR 5 MG tablet TAKE 1 TABLET BY MOUTH EVERY DAY 30 tablet 3  . LIALDA 1.2 G EC tablet TAKE 2 TABLETS BY MOUTH TWICE DAILY 120 tablet 5  . medroxyPROGESTERone (DEPO-PROVERA) 150 MG/ML injection For IM injection every 3 months 1 mL 4  . Calcium Carbonate-Vitamin D (CALCIUM + D PO) Take by mouth daily.     No current facility-administered medications on file prior to visit.        Objective:   Physical Exam Blood pressure 106/70, pulse 72, temperature 98.2 F (36.8 C), height 5' 3" (1.6 m), weight 158 lb 14.4 oz (72.077 kg).  Alert and oriented. Skin warm and dry. Oral mucosa is moist.   . Sclera anicteric, conjunctivae is pink. Thyroid not enlarged. No cervical lymphadenopathy. Lungs clear. Heart regular rate and rhythm.  Abdomen is soft. Bowel sounds are positive. No hepatomegaly. No abdominal masses felt. No tenderness.  No edema to lower extremities.        Assessment & Plan:  Hx of UC. Needs screening colonoscopy. UC; She seems to be in remission. Doing well. No G I  problems.  

## 2015-09-02 NOTE — Telephone Encounter (Signed)
Patient needs suprep 

## 2015-09-02 NOTE — Patient Instructions (Signed)
Screening colonoscopy

## 2015-09-03 MED ORDER — SUPREP BOWEL PREP KIT 17.5-3.13-1.6 GM/177ML PO SOLN: 1.0000 | Freq: Once | ORAL | Status: DC

## 2015-09-20 ENCOUNTER — Other Ambulatory Visit (HOSPITAL_COMMUNITY)
Admission: RE | Admit: 2015-09-20 | Discharge: 2015-09-20 | Disposition: A | Discharge status: Home / Self Care | Payer: BC Managed Care – PPO | Source: Ambulatory Visit | Attending: Adult Health | Admitting: Adult Health

## 2015-09-20 ENCOUNTER — Encounter: Payer: Self-pay | Admitting: Adult Health

## 2015-09-20 ENCOUNTER — Ambulatory Visit (INDEPENDENT_AMBULATORY_CARE_PROVIDER_SITE_OTHER): Payer: BC Managed Care – PPO | Admitting: Adult Health

## 2015-09-20 VITALS — BP 108/76 | HR 76 | Ht 63.0 in | Wt 156.0 lb

## 2015-09-20 DIAGNOSIS — Z01419 Encounter for gynecological examination (general) (routine) without abnormal findings: Secondary | ICD-10-CM

## 2015-09-20 DIAGNOSIS — Z3042 Encounter for surveillance of injectable contraceptive: Secondary | ICD-10-CM

## 2015-09-20 DIAGNOSIS — Z1211 Encounter for screening for malignant neoplasm of colon: Secondary | ICD-10-CM

## 2015-09-20 DIAGNOSIS — Z1151 Encounter for screening for human papillomavirus (HPV): Secondary | ICD-10-CM | POA: Insufficient documentation

## 2015-09-20 LAB — HEMOCCULT GUIAC POC 1CARD (OFFICE): Fecal Occult Blood, POC: NEGATIVE

## 2015-09-20 MED ORDER — MEDROXYPROGESTERONE ACETATE 150 MG/ML IM SUSP: INTRAMUSCULAR | Status: DC

## 2015-09-20 NOTE — Patient Instructions (Signed)
Physical in  1 year Mammogram yearly Colonoscopy in December

## 2015-09-20 NOTE — Progress Notes (Signed)
Patient ID: Sandra Crane, female   DOB: [birth year], 49 y.o.   MRN: [id number] History of Present Illness:  Sandra Crane is a 49 year old white female,married,G3P3 in for a well woman gyn exam and pap.  Current Medications, Allergies, Past Medical History, Past Surgical History, Family History and Social History were reviewed in [employer] record.     Review of Systems: Patient denies any headaches, hearing loss,, blurred vision, shortness of breath, chest pain, abdominal pain, problems with bowel movements, urination, or intercourse. No joint pain or mood swings.Is tired at times,has UC and is getting colonoscopy in December with Dr Laural Golden. She is happy with her depo, no periods.   Physical Exam:BP 108/76 mmHg  Pulse 76  Ht 5' 3"  (1.6 m)  Wt 156 lb (70.761 kg)  BMI 27.64 kg/m2 General:  Well developed, well nourished, no acute distress Skin:  Warm and dry Neck:  Midline trachea, normal thyroid, good ROM, no lymphadenopathy Lungs; Clear to auscultation bilaterally Breast:  No dominant palpable mass, retraction, or nipple discharge Cardiovascular: Regular rate and rhythm Abdomen:  Soft, non tender, no hepatosplenomegaly Pelvic:  External genitalia is normal in appearance, no lesions.  The vagina is normal in appearance. Urethra has no lesions or masses. The cervix is bulbous.  Uterus is felt to be normal size, shape, and contour.  No adnexal masses or tenderness noted.Bladder is non tender, no masses felt. Rectal: Good sphincter tone, no polyps, or hemorrhoids felt.  Hemoccult negative. Extremities/musculoskeletal:  No swelling or varicosities noted, no clubbing or cyanosis Psych:  No mood changes, alert and cooperative,seems happy   Impression: Well woman gyn exam and pap Contraceptive management    Plan: Physical in 1 year Mammogram yearly Colonoscopy in December Check TSH,other labs with Dr Laural Golden Get flu shot

## 2015-09-21 LAB — TSH: TSH: 0.665 u[IU]/mL (ref 0.450–4.500)

## 2015-09-23 ENCOUNTER — Telehealth: Payer: Self-pay | Admitting: Adult Health

## 2015-09-23 LAB — CYTOLOGY - PAP

## 2015-09-23 NOTE — Telephone Encounter (Signed)
Pt aware of labs  

## 2015-10-02 ENCOUNTER — Encounter (INDEPENDENT_AMBULATORY_CARE_PROVIDER_SITE_OTHER): Payer: Self-pay | Admitting: *Deleted

## 2015-10-02 ENCOUNTER — Other Ambulatory Visit (INDEPENDENT_AMBULATORY_CARE_PROVIDER_SITE_OTHER): Payer: Self-pay | Admitting: *Deleted

## 2015-10-02 DIAGNOSIS — K5190 Ulcerative colitis, unspecified, without complications: Secondary | ICD-10-CM

## 2015-10-17 LAB — HEPATIC FUNCTION PANEL
ALBUMIN: 4.6 g/dL (ref 3.6–5.1)
ALK PHOS: 46 U/L (ref 33–130)
ALT: 15 U/L (ref 6–29)
AST: 18 U/L (ref 10–35)
BILIRUBIN INDIRECT: 0.5 mg/dL (ref 0.2–1.2)
BILIRUBIN TOTAL: 0.6 mg/dL (ref 0.2–1.2)
Bilirubin, Direct: 0.1 mg/dL (ref <=0.2)
TOTAL PROTEIN: 7.1 g/dL (ref 6.1–8.1)

## 2015-10-17 LAB — CBC WITH DIFFERENTIAL/PLATELET
BASOS ABS: 0.1 10*3/uL (ref 0.0–0.1)
BASOS PCT: 1 % (ref 0–1)
EOS PCT: 1 % (ref 0–5)
Eosinophils Absolute: 0.1 10*3/uL (ref 0.0–0.7)
HCT: 42.4 % (ref 36.0–46.0)
Hemoglobin: 13.9 g/dL (ref 12.0–15.0)
LYMPHS ABS: 0.8 10*3/uL (ref 0.7–4.0)
Lymphocytes Relative: 15 % (ref 12–46)
MCH: 31.7 pg (ref 26.0–34.0)
MCHC: 32.8 g/dL (ref 30.0–36.0)
MCV: 96.8 fL (ref 78.0–100.0)
MPV: 9.5 fL (ref 8.6–12.4)
Monocytes Absolute: 1.1 10*3/uL — ABNORMAL HIGH (ref 0.1–1.0)
Monocytes Relative: 21 % — ABNORMAL HIGH (ref 3–12)
NEUTROS PCT: 62 % (ref 43–77)
Neutro Abs: 3.2 10*3/uL (ref 1.7–7.7)
PLATELETS: 267 10*3/uL (ref 150–400)
RBC: 4.38 MIL/uL (ref 3.87–5.11)
RDW: 13.5 % (ref 11.5–15.5)
WBC: 5.2 10*3/uL (ref 4.0–10.5)

## 2015-10-18 ENCOUNTER — Telehealth (INDEPENDENT_AMBULATORY_CARE_PROVIDER_SITE_OTHER): Payer: Self-pay | Admitting: *Deleted

## 2015-10-18 DIAGNOSIS — E785 Hyperlipidemia, unspecified: Secondary | ICD-10-CM

## 2015-10-18 DIAGNOSIS — K5180 Other ulcerative colitis without complications: Secondary | ICD-10-CM

## 2015-10-18 DIAGNOSIS — R748 Abnormal levels of other serum enzymes: Secondary | ICD-10-CM

## 2015-10-18 DIAGNOSIS — K5120 Ulcerative (chronic) proctitis without complications: Secondary | ICD-10-CM

## 2015-10-18 NOTE — Telephone Encounter (Signed)
Per Dr.Rehman the patient will need to have labs drawn in 4 months.

## 2015-10-24 ENCOUNTER — Encounter (HOSPITAL_COMMUNITY)
Admission: RE | Disposition: A | Discharge status: Home / Self Care | Payer: Self-pay | Source: Ambulatory Visit | Attending: Internal Medicine

## 2015-10-24 ENCOUNTER — Encounter (HOSPITAL_COMMUNITY): Payer: Self-pay | Admitting: *Deleted

## 2015-10-24 ENCOUNTER — Ambulatory Visit (HOSPITAL_COMMUNITY)
Admission: RE | Admit: 2015-10-24 | Discharge: 2015-10-24 | Disposition: A | Discharge status: Home / Self Care | Payer: BC Managed Care – PPO | Source: Ambulatory Visit | Attending: Internal Medicine | Admitting: Internal Medicine

## 2015-10-24 DIAGNOSIS — E7800 Pure hypercholesterolemia, unspecified: Secondary | ICD-10-CM | POA: Insufficient documentation

## 2015-10-24 DIAGNOSIS — K648 Other hemorrhoids: Secondary | ICD-10-CM | POA: Insufficient documentation

## 2015-10-24 DIAGNOSIS — K529 Noninfective gastroenteritis and colitis, unspecified: Secondary | ICD-10-CM | POA: Insufficient documentation

## 2015-10-24 DIAGNOSIS — Z1211 Encounter for screening for malignant neoplasm of colon: Secondary | ICD-10-CM | POA: Insufficient documentation

## 2015-10-24 DIAGNOSIS — K5190 Ulcerative colitis, unspecified, without complications: Secondary | ICD-10-CM | POA: Diagnosis not present

## 2015-10-24 DIAGNOSIS — Z79899 Other long term (current) drug therapy: Secondary | ICD-10-CM | POA: Diagnosis not present

## 2015-10-24 DIAGNOSIS — K635 Polyp of colon: Secondary | ICD-10-CM | POA: Insufficient documentation

## 2015-10-24 DIAGNOSIS — D122 Benign neoplasm of ascending colon: Secondary | ICD-10-CM | POA: Diagnosis not present

## 2015-10-24 HISTORY — PX: COLONOSCOPY: SHX5424

## 2015-10-24 SURGERY — COLONOSCOPY
Anesthesia: Moderate Sedation

## 2015-10-24 MED ORDER — MEPERIDINE HCL 50 MG/ML IJ SOLN
INTRAMUSCULAR | Status: DC | PRN
  Administered 2015-10-24 (×2): 25 mg via INTRAVENOUS

## 2015-10-24 MED ORDER — MIDAZOLAM HCL 5 MG/5ML IJ SOLN
INTRAMUSCULAR | Status: DC | PRN
  Administered 2015-10-24: 1 mg via INTRAVENOUS
  Administered 2015-10-24 (×3): 2 mg via INTRAVENOUS
  Administered 2015-10-24: 1 mg via INTRAVENOUS

## 2015-10-24 MED ORDER — MIDAZOLAM HCL 5 MG/5ML IJ SOLN
INTRAMUSCULAR | Status: AC
  Filled 2015-10-24: qty 10

## 2015-10-24 MED ORDER — SODIUM CHLORIDE 0.9 % IV SOLN
INTRAVENOUS | Status: DC
  Administered 2015-10-24: 1000 mL via INTRAVENOUS

## 2015-10-24 MED ORDER — MEPERIDINE HCL 50 MG/ML IJ SOLN
INTRAMUSCULAR | Status: AC
  Filled 2015-10-24: qty 1

## 2015-10-24 MED ORDER — SIMETHICONE 40 MG/0.6ML PO SUSP
ORAL | Status: DC | PRN
  Administered 2015-10-24: 2.5 mL

## 2015-10-24 NOTE — H&P (Signed)
Sandra Crane is an 49 y.o. female.   Chief Complaint:  Patient is here for colonoscopy. HPI:   Patient is 49 year old Caucasian female who is chronic ulcerative colitis and is here for surveillance colonoscopy. Her condition was diagnosed in June 2006 when she had colonoscopy. 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 She has remained in remission for several years. She has 1 formed stool daily. She denies abdominal pain rectal bleeding. Family history is negative for IBD or CRC.  Past Medical History  Diagnosis Date  . Ulcerative colitis   . Elevated cholesterol   . Contraceptive management 09/12/2013    Past Surgical History  Procedure Laterality Date  . Cesarean section  [phone]  . Colonoscopy    . Sigmoidoscopy  07/19/2009  . Wisdom tooth extraction    . Root canal with crown      Family History  Problem Relation Age of Onset  . Thyroid cancer Mother   . Diabetes Mother   . Diabetes Father   . Hypertension Sister   . Hyperlipidemia Sister   . Diabetes Sister   . Healthy Brother   . Healthy Sister   . Healthy Brother   . Glaucoma Brother   . Healthy Daughter   . Healthy Daughter   . Healthy Son    Social History:  reports that she has never smoked. She has never used smokeless tobacco. She reports that she drinks alcohol. She reports that she does not use illicit drugs.  Allergies: No Known Allergies  Medications Prior to Admission  Medication Sig Dispense Refill  . CRESTOR 5 MG tablet TAKE 1 TABLET BY MOUTH EVERY DAY 30 tablet 3  . LIALDA 1.2 G EC tablet TAKE 2 TABLETS BY MOUTH TWICE DAILY 120 tablet 5  . medroxyPROGESTERone (DEPO-PROVERA) 150 MG/ML injection For IM injection every 3 months 1 mL 4  . mercaptopurine (PURINETHOL) 50 MG tablet Take 1 tablet (50 mg total) by mouth daily. Give on an empty stomach 1 hour before or 2 hours after meals. Caution: Chemotherapy. 30 tablet 11  . Multiple Vitamin  (MULTIVITAMIN WITH MINERALS) TABS tablet Take 1 tablet by mouth daily.    [place] [employer]unning BOWEL PREP SOLN Take 1 kit by mouth once. 1 Bottle 0    No results found for this or any previous visit (from the past 48 hour(s)). No results found.  ROS  Blood pressure 121/74, pulse 90, temperature 98.7 F (37.1 C), temperature source Oral, resp. rate 20, height 5' 3"  (1.6 m), weight 156 lb (70.761 kg), SpO2 99 %. Physical Exam  Constitutional: She appears well-developed and well-nourished.  HENT:  Mouth/Throat: Oropharynx is clear and moist.  Eyes: Conjunctivae are normal. No scleral icterus.  Neck: No thyromegaly present.  Cardiovascular: Normal rate, regular rhythm and normal heart sounds.   No murmur heard. Respiratory: Effort normal and breath sounds normal.  GI: Soft. She exhibits no distension and no mass. There is no tenderness.  Musculoskeletal: She exhibits no edema.  Lymphadenopathy:    She has no cervical adenopathy.  Neurological: She is alert.  Skin: Skin is warm and dry.     Assessment/Plan  Chronic ulcerative colitis in remission.  Surveillance colonoscopy  Michall Noffke U 10/24/2015, 11:14 AM

## 2015-10-24 NOTE — Discharge Instructions (Signed)
No aspirin or NSAIDs for 1 week. Resume usual medications and diet. No driving for 24 hours. Physician will call with biopsy results.     Colonoscopy, Care After These instructions give you information on caring for yourself after your procedure. Your doctor may also give you more specific instructions. Call your doctor if you have any problems or questions after your procedure. HOME CARE  Do not drive for 24 hours.  Do not sign important papers or use machinery for 24 hours.  You may shower.  You may go back to your usual activities, but go slower for the first 24 hours.  Take rest breaks often during the first 24 hours.  Walk around or use warm packs on your belly (abdomen) if you have belly cramping or gas.  Drink enough fluids to keep your pee (urine) clear or pale yellow.  Resume your normal diet. Avoid heavy or fried foods.  Avoid drinking alcohol for 24 hours or as told by your doctor.  Only take medicines as told by your doctor. If a tissue sample (biopsy) was taken during the procedure:   Do not take aspirin or blood thinners for 7 days, or as told by your doctor.  Do not drink alcohol for 7 days, or as told by your doctor.  Eat soft foods for the first 24 hours. GET HELP IF: You still have a small amount of blood in your poop (stool) 2-3 days after the procedure. GET HELP RIGHT AWAY IF:  You have more than a small amount of blood in your poop.  You see clumps of tissue (blood clots) in your poop.  Your belly is puffy (swollen).  You feel sick to your stomach (nauseous) or throw up (vomit).  You have a fever.  You have belly pain that gets worse and medicine does not help. MAKE SURE YOU:  Understand these instructions.  Will watch your condition.  Will get help right away if you are not doing well or get worse.   This information is not intended to replace advice given to you by your health care provider. Make sure you discuss any questions you  have with your health care provider.   Document Released: 11/28/2010 Document Revised: 10/31/2013 Document Reviewed: 07/03/2013 Elsevier Interactive Patient Education 2016 Elsevier Inc.   Colon Polyps Polyps are lumps of extra tissue growing inside the body. Polyps can grow in the large intestine (colon). Most colon polyps are noncancerous (benign). However, some colon polyps can become cancerous over time. Polyps that are larger than a pea may be harmful. To be safe, caregivers remove and test all polyps. CAUSES  Polyps form when mutations in the genes cause your cells to grow and divide even though no more tissue is needed. RISK FACTORS There are a number of risk factors that can increase your chances of getting colon polyps. They include:  Being older than 50 years.  Family history of colon polyps or colon cancer.  Long-term colon diseases, such as colitis or Crohn disease.  Being overweight.  Smoking.  Being inactive.  Drinking too much alcohol. SYMPTOMS  Most small polyps do not cause symptoms. If symptoms are present, they may include:  Blood in the stool. The stool may look dark red or black.  Constipation or diarrhea that lasts longer than 1 week. DIAGNOSIS People often do not know they have polyps until their caregiver finds them during a regular checkup. Your caregiver can use 4 tests to check for polyps:  Digital rectal exam.  The caregiver wears gloves and feels inside the rectum. This test would find polyps only in the rectum.  Barium enema. The caregiver puts a liquid called barium into your rectum before taking X-rays of your colon. Barium makes your colon look white. Polyps are dark, so they are easy to see in the X-ray pictures.  Sigmoidoscopy. A thin, flexible tube (sigmoidoscope) is placed into your rectum. The sigmoidoscope has a light and tiny camera in it. The caregiver uses the sigmoidoscope to look at the last third of your colon.  Colonoscopy. This  test is like sigmoidoscopy, but the caregiver looks at the entire colon. This is the most common method for finding and removing polyps. TREATMENT  Any polyps will be removed during a sigmoidoscopy or colonoscopy. The polyps are then tested for cancer. PREVENTION  To help lower your risk of getting more colon polyps:  Eat plenty of fruits and vegetables. Avoid eating fatty foods.  Do not smoke.  Avoid drinking alcohol.  Exercise every day.  Lose weight if recommended by your caregiver.  Eat plenty of calcium and folate. Foods that are rich in calcium include milk, cheese, and broccoli. Foods that are rich in folate include chickpeas, kidney beans, and spinach. HOME CARE INSTRUCTIONS Keep all follow-up appointments as directed by your caregiver. You may need periodic exams to check for polyps. SEEK MEDICAL CARE IF: You notice bleeding during a bowel movement.   This information is not intended to replace advice given to you by your health care provider. Make sure you discuss any questions you have with your health care provider.   Document Released: 07/22/2004 Document Revised: 11/16/2014 Document Reviewed: 01/05/2012 Elsevier Interactive Patient Education [employer].

## 2015-10-24 NOTE — Op Note (Signed)
COLONOSCOPY PROCEDURE REPORT  PATIENT:  Sandra Crane  MR#:  [id number] Birthdate:  [birth year], 49 y.o., female Endoscopist:  Dr. Rogene Houston, MD  Procedure Date: 10/24/2015  Procedure:   Colonoscopy  Indications:  Patient is 49 year old Caucasian female with over 10 year history of ulcerative colitis was undergoing surveillance colonoscopy. She presently is in remission.  Informed Consent:  The procedure and risks were reviewed with the patient and informed consent was obtained.  Medications:  Demerol 50 mg IV Versed 8 mg IV  Description of procedure:  After a digital rectal exam was performed, that colonoscope was advanced from the anus through the rectum and colon to the area of the cecum, ileocecal valve and appendiceal orifice. The cecum was deeply intubated. These structures were well-seen and photographed for the record. From the level of the cecum and ileocecal valve, the scope was slowly and cautiously withdrawn. The mucosal surfaces were carefully surveyed utilizing scope tip to flexion to facilitate fold flattening as needed. The scope was pulled down into the rectum where a thorough exam including retroflexion was performed.  Findings:   Prep excellent. 6 mm polyp hot snared from ascending colon. Mucosa of rest of the colon was normal. Some areas with scarring but no other abnormalities noted. Normal rectal mucosa. Small hemorrhoids above the dentate line.   Therapeutic/Diagnostic Maneuvers Performed:  See above  Complications:   none  EBL: none  Cecal Withdrawal Time:   10  minutes  Impression:   Examination performed to cecum.  Ulcerative colitis is in remission( complete mucosal healing).  6 mm polyp hot snared from ascending colon.  Small internal hemorrhoids.  Recommendations:  Standard instructions given.  no aspirin or NSAIDs for 1 week. I will contact patient with biopsy results and further recommendations.  Nadav Swindell U  10/24/2015 12:05  PM  CC: Dr. Glo Herring., MD & Dr. Rayne Du ref. provider found CC:  Ms. Derrek Monaco, NP

## 2015-10-24 NOTE — Progress Notes (Signed)
Please excuse Sandra Crane from work today December 15th, 2016. She may return to work on Monday December 19th, 2016.

## 2015-10-30 ENCOUNTER — Encounter (HOSPITAL_COMMUNITY): Payer: Self-pay | Admitting: Internal Medicine

## 2016-02-05 ENCOUNTER — Other Ambulatory Visit (INDEPENDENT_AMBULATORY_CARE_PROVIDER_SITE_OTHER): Payer: Self-pay | Admitting: *Deleted

## 2016-02-05 ENCOUNTER — Encounter (INDEPENDENT_AMBULATORY_CARE_PROVIDER_SITE_OTHER): Payer: Self-pay | Admitting: *Deleted

## 2016-02-05 DIAGNOSIS — K5180 Other ulcerative colitis without complications: Secondary | ICD-10-CM

## 2016-02-05 DIAGNOSIS — E785 Hyperlipidemia, unspecified: Secondary | ICD-10-CM

## 2016-02-05 DIAGNOSIS — R748 Abnormal levels of other serum enzymes: Secondary | ICD-10-CM

## 2016-02-10 ENCOUNTER — Telehealth (INDEPENDENT_AMBULATORY_CARE_PROVIDER_SITE_OTHER): Payer: Self-pay | Admitting: *Deleted

## 2016-02-10 NOTE — Telephone Encounter (Signed)
Patient was called and advised on her lab draw coming up.

## 2016-02-10 NOTE — Telephone Encounter (Signed)
Sandra Crane got you lab letter -- thinks Rehman wanted cholesterol screening too needs to know so she can fast if needed -- [phone]

## 2016-02-17 LAB — CBC WITH DIFFERENTIAL/PLATELET
BASOS PCT: 0 %
Basophils Absolute: 0 cells/uL (ref 0–200)
EOS ABS: 31 {cells}/uL (ref 15–500)
Eosinophils Relative: 1 %
HCT: 41.6 % (ref 35.0–45.0)
Hemoglobin: 13.6 g/dL (ref 11.7–15.5)
LYMPHS PCT: 15 %
Lymphs Abs: 465 cells/uL — ABNORMAL LOW (ref 850–3900)
MCH: 32.9 pg (ref 27.0–33.0)
MCHC: 32.7 g/dL (ref 32.0–36.0)
MCV: 100.5 fL — AB (ref 80.0–100.0)
MONOS PCT: 21 %
MPV: 9.3 fL (ref 7.5–12.5)
Monocytes Absolute: 651 cells/uL (ref 200–950)
NEUTROS PCT: 63 %
Neutro Abs: 1953 cells/uL (ref 1500–7800)
PLATELETS: 255 10*3/uL (ref 140–400)
RBC: 4.14 MIL/uL (ref 3.80–5.10)
RDW: 13.5 % (ref 11.0–15.0)
WBC: 3.1 10*3/uL — ABNORMAL LOW (ref 3.8–10.8)

## 2016-02-17 LAB — HEPATIC FUNCTION PANEL
ALBUMIN: 4.3 g/dL (ref 3.6–5.1)
ALT: 15 U/L (ref 6–29)
AST: 15 U/L (ref 10–35)
Alkaline Phosphatase: 39 U/L (ref 33–130)
BILIRUBIN INDIRECT: 0.8 mg/dL (ref 0.2–1.2)
Bilirubin, Direct: 0.2 mg/dL (ref <=0.2)
TOTAL PROTEIN: 6.7 g/dL (ref 6.1–8.1)
Total Bilirubin: 1 mg/dL (ref 0.2–1.2)

## 2016-02-17 LAB — LIPID PANEL
CHOLESTEROL: 211 mg/dL — AB (ref 125–200)
HDL: 60 mg/dL (ref >=46)
LDL Cholesterol: 138 mg/dL — ABNORMAL HIGH (ref <130)
TRIGLYCERIDES: 67 mg/dL (ref <150)
Total CHOL/HDL Ratio: 3.5 Ratio (ref <=5.0)
VLDL: 13 mg/dL (ref <30)

## 2016-02-24 ENCOUNTER — Telehealth (INDEPENDENT_AMBULATORY_CARE_PROVIDER_SITE_OTHER): Payer: Self-pay | Admitting: *Deleted

## 2016-02-24 DIAGNOSIS — K51918 Ulcerative colitis, unspecified with other complication: Secondary | ICD-10-CM

## 2016-02-24 NOTE — Telephone Encounter (Signed)
Per Dr.Rehman the patient will need to have labs drawn on 03/18/2016.

## 2016-03-04 ENCOUNTER — Encounter (INDEPENDENT_AMBULATORY_CARE_PROVIDER_SITE_OTHER): Payer: Self-pay | Admitting: *Deleted

## 2016-03-04 ENCOUNTER — Other Ambulatory Visit (INDEPENDENT_AMBULATORY_CARE_PROVIDER_SITE_OTHER): Payer: Self-pay | Admitting: *Deleted

## 2016-03-04 DIAGNOSIS — K51918 Ulcerative colitis, unspecified with other complication: Secondary | ICD-10-CM

## 2016-03-07 ENCOUNTER — Other Ambulatory Visit (INDEPENDENT_AMBULATORY_CARE_PROVIDER_SITE_OTHER): Payer: Self-pay | Admitting: Internal Medicine

## 2016-03-13 ENCOUNTER — Other Ambulatory Visit: Payer: Self-pay

## 2016-03-13 DIAGNOSIS — Z1231 Encounter for screening mammogram for malignant neoplasm of breast: Secondary | ICD-10-CM

## 2016-03-18 LAB — CBC WITH DIFFERENTIAL/PLATELET
Basophils Absolute: 0 cells/uL (ref 0–200)
Basophils Relative: 0 %
EOS ABS: 45 {cells}/uL (ref 15–500)
Eosinophils Relative: 1 %
HEMATOCRIT: 39.9 % (ref 35.0–45.0)
HEMOGLOBIN: 13.3 g/dL (ref 11.7–15.5)
LYMPHS ABS: 855 {cells}/uL (ref 850–3900)
Lymphocytes Relative: 19 %
MCH: 32.8 pg (ref 27.0–33.0)
MCHC: 33.3 g/dL (ref 32.0–36.0)
MCV: 98.3 fL (ref 80.0–100.0)
MONO ABS: 765 {cells}/uL (ref 200–950)
MPV: 9.8 fL (ref 7.5–12.5)
Monocytes Relative: 17 %
NEUTROS PCT: 63 %
Neutro Abs: 2835 cells/uL (ref 1500–7800)
Platelets: 257 10*3/uL (ref 140–400)
RBC: 4.06 MIL/uL (ref 3.80–5.10)
RDW: 13.1 % (ref 11.0–15.0)
WBC: 4.5 10*3/uL (ref 3.8–10.8)

## 2016-03-20 ENCOUNTER — Telehealth (INDEPENDENT_AMBULATORY_CARE_PROVIDER_SITE_OTHER): Payer: Self-pay | Admitting: *Deleted

## 2016-03-20 DIAGNOSIS — D509 Iron deficiency anemia, unspecified: Secondary | ICD-10-CM

## 2016-03-20 NOTE — Telephone Encounter (Signed)
Per Dr.Rehman the patient will need to have labs drawn in 3 months 

## 2016-04-11 ENCOUNTER — Other Ambulatory Visit (INDEPENDENT_AMBULATORY_CARE_PROVIDER_SITE_OTHER): Payer: Self-pay | Admitting: Internal Medicine

## 2016-05-11 ENCOUNTER — Ambulatory Visit
Admission: RE | Admit: 2016-05-11 | Discharge: 2016-05-11 | Disposition: A | Discharge status: Home / Self Care | Payer: BC Managed Care – PPO | Source: Ambulatory Visit

## 2016-05-11 DIAGNOSIS — Z1231 Encounter for screening mammogram for malignant neoplasm of breast: Secondary | ICD-10-CM

## 2016-06-04 ENCOUNTER — Other Ambulatory Visit (INDEPENDENT_AMBULATORY_CARE_PROVIDER_SITE_OTHER): Payer: Self-pay | Admitting: *Deleted

## 2016-06-04 ENCOUNTER — Encounter (INDEPENDENT_AMBULATORY_CARE_PROVIDER_SITE_OTHER): Payer: Self-pay | Admitting: *Deleted

## 2016-06-04 DIAGNOSIS — D509 Iron deficiency anemia, unspecified: Secondary | ICD-10-CM

## 2016-06-22 LAB — CBC WITH DIFFERENTIAL/PLATELET
BASOS ABS: 37 {cells}/uL (ref 0–200)
BASOS PCT: 1 %
EOS ABS: 74 {cells}/uL (ref 15–500)
EOS PCT: 2 %
HCT: 42 % (ref 35.0–45.0)
Hemoglobin: 13.6 g/dL (ref 11.7–15.5)
LYMPHS PCT: 23 %
Lymphs Abs: 851 cells/uL (ref 850–3900)
MCH: 32.4 pg (ref 27.0–33.0)
MCHC: 32.4 g/dL (ref 32.0–36.0)
MCV: 100 fL (ref 80.0–100.0)
MONOS PCT: 18 %
MPV: 9.7 fL (ref 7.5–12.5)
Monocytes Absolute: 666 cells/uL (ref 200–950)
NEUTROS ABS: 2072 {cells}/uL (ref 1500–7800)
Neutrophils Relative %: 56 %
PLATELETS: 230 10*3/uL (ref 140–400)
RBC: 4.2 MIL/uL (ref 3.80–5.10)
RDW: 13.4 % (ref 11.0–15.0)
WBC: 3.7 10*3/uL — ABNORMAL LOW (ref 3.8–10.8)

## 2016-06-24 ENCOUNTER — Other Ambulatory Visit (INDEPENDENT_AMBULATORY_CARE_PROVIDER_SITE_OTHER): Payer: Self-pay | Admitting: *Deleted

## 2016-06-24 DIAGNOSIS — D509 Iron deficiency anemia, unspecified: Secondary | ICD-10-CM

## 2016-09-03 ENCOUNTER — Other Ambulatory Visit (INDEPENDENT_AMBULATORY_CARE_PROVIDER_SITE_OTHER): Payer: Self-pay | Admitting: *Deleted

## 2016-09-03 ENCOUNTER — Encounter (INDEPENDENT_AMBULATORY_CARE_PROVIDER_SITE_OTHER): Payer: Self-pay | Admitting: *Deleted

## 2016-09-03 DIAGNOSIS — D509 Iron deficiency anemia, unspecified: Secondary | ICD-10-CM

## 2016-09-22 ENCOUNTER — Other Ambulatory Visit: Payer: BC Managed Care – PPO | Admitting: Adult Health

## 2016-09-23 ENCOUNTER — Encounter: Payer: Self-pay | Admitting: Adult Health

## 2016-09-23 ENCOUNTER — Ambulatory Visit (INDEPENDENT_AMBULATORY_CARE_PROVIDER_SITE_OTHER): Payer: BC Managed Care – PPO | Admitting: Adult Health

## 2016-09-23 VITALS — BP 110/64 | HR 66 | Ht 62.5 in | Wt 160.0 lb

## 2016-09-23 DIAGNOSIS — Z1212 Encounter for screening for malignant neoplasm of rectum: Secondary | ICD-10-CM | POA: Diagnosis not present

## 2016-09-23 DIAGNOSIS — Z3042 Encounter for surveillance of injectable contraceptive: Secondary | ICD-10-CM

## 2016-09-23 DIAGNOSIS — Z01419 Encounter for gynecological examination (general) (routine) without abnormal findings: Secondary | ICD-10-CM

## 2016-09-23 LAB — HEMOCCULT GUIAC POC 1CARD (OFFICE): Fecal Occult Blood, POC: NEGATIVE

## 2016-09-23 MED ORDER — MEDROXYPROGESTERONE ACETATE 150 MG/ML IM SUSP: INTRAMUSCULAR | 4 refills | Status: DC

## 2016-09-23 NOTE — Progress Notes (Signed)
Patient ID: Sandra Crane, female   DOB: 19-Apr-1966, 50 y.o.   MRN: [id number] History of Present Illness: Sandra Crane is a 50 year old white female,married in for well woman gyn exam, she had normal pap with negative HPV 09/20/15.She is happy with depo and needs refill. PCP is [place].   Current Medications, Allergies, Past Medical History, Past Surgical History, Family History and Social History were reviewed in [employer] record.     Review of Systems: Patient denies any headaches, hearing loss, fatigue, blurred vision, shortness of breath, chest pain, abdominal pain, problems with bowel movements, urination, or intercourse. No joint pain or mood swings.    Physical Exam:BP 110/64 (BP Location: Left Arm, Patient Position: Sitting, Cuff Size: Normal)   Pulse 66   Ht 5' 2.5" (1.588 m)   Wt 160 lb (72.6 kg)   BMI 28.80 kg/m  General:  Well developed, well nourished, no acute distress Skin:  Warm and dry Neck:  Midline trachea, normal thyroid, good ROM, no lymphadenopathy Lungs; Clear to auscultation bilaterally Breast:  No dominant palpable mass, retraction, or nipple discharge Cardiovascular: Regular rate and rhythm Abdomen:  Soft, non tender, no hepatosplenomegaly Pelvic:  External genitalia is normal in appearance, no lesions.  The vagina is normal in appearance. Urethra has no lesions or masses. The cervix is bulbous.  Uterus is felt to be normal size, shape, and contour.  No adnexal masses or tenderness noted.Bladder is non tender, no masses felt. Rectal: Good sphincter tone, no polyps, or hemorrhoids felt.  Hemoccult negative. Extremities/musculoskeletal:  No swelling or varicosities noted, no clubbing or cyanosis Psych:  No mood changes, alert and cooperative,seems happy PHQ 2 score 0.  Impression: 1. Well female exam with routine gynecological exam   2. Encounter for surveillance of injectable contraceptive       Plan: Refilled depo provera 150  mg x 4  Physical in 1 year Pap 2019 Mammogram yearly  Colonoscopy per Dr Laural Golden  Labs per Dr Laural Golden

## 2016-09-23 NOTE — Patient Instructions (Addendum)
Physical in 1 year Pap 2019 Mammogram yearly  Colonoscopy per Dr Laural Golden  Labs per Dr Laural Golden

## 2016-09-24 LAB — CBC WITH DIFFERENTIAL/PLATELET
BASOS ABS: 0 {cells}/uL (ref 0–200)
Basophils Relative: 0 %
EOS PCT: 1 %
Eosinophils Absolute: 51 cells/uL (ref 15–500)
HCT: 42.1 % (ref 35.0–45.0)
HEMOGLOBIN: 14.3 g/dL (ref 11.7–15.5)
LYMPHS ABS: 1275 {cells}/uL (ref 850–3900)
Lymphocytes Relative: 25 %
MCH: 33 pg (ref 27.0–33.0)
MCHC: 34 g/dL (ref 32.0–36.0)
MCV: 97.2 fL (ref 80.0–100.0)
MPV: 9.4 fL (ref 7.5–12.5)
Monocytes Absolute: 306 cells/uL (ref 200–950)
Monocytes Relative: 6 %
NEUTROS ABS: 3468 {cells}/uL (ref 1500–7800)
Neutrophils Relative %: 68 %
Platelets: 281 10*3/uL (ref 140–400)
RBC: 4.33 MIL/uL (ref 3.80–5.10)
RDW: 13.1 % (ref 11.0–15.0)
WBC: 5.1 10*3/uL (ref 3.8–10.8)

## 2016-09-28 ENCOUNTER — Ambulatory Visit (INDEPENDENT_AMBULATORY_CARE_PROVIDER_SITE_OTHER): Payer: BC Managed Care – PPO | Admitting: Otolaryngology

## 2016-09-28 ENCOUNTER — Other Ambulatory Visit (INDEPENDENT_AMBULATORY_CARE_PROVIDER_SITE_OTHER): Payer: Self-pay | Admitting: *Deleted

## 2016-09-28 DIAGNOSIS — J351 Hypertrophy of tonsils: Secondary | ICD-10-CM | POA: Diagnosis not present

## 2016-09-28 DIAGNOSIS — R945 Abnormal results of liver function studies: Secondary | ICD-10-CM

## 2016-09-28 DIAGNOSIS — R7989 Other specified abnormal findings of blood chemistry: Secondary | ICD-10-CM

## 2016-09-28 DIAGNOSIS — R070 Pain in throat: Secondary | ICD-10-CM

## 2016-09-28 DIAGNOSIS — K5180 Other ulcerative colitis without complications: Secondary | ICD-10-CM

## 2016-10-11 ENCOUNTER — Other Ambulatory Visit (INDEPENDENT_AMBULATORY_CARE_PROVIDER_SITE_OTHER): Payer: Self-pay | Admitting: Internal Medicine

## 2016-10-11 DIAGNOSIS — K5120 Ulcerative (chronic) proctitis without complications: Secondary | ICD-10-CM

## 2016-10-22 ENCOUNTER — Ambulatory Visit (INDEPENDENT_AMBULATORY_CARE_PROVIDER_SITE_OTHER): Payer: BC Managed Care – PPO | Admitting: Otolaryngology

## 2016-10-22 DIAGNOSIS — J3501 Chronic tonsillitis: Secondary | ICD-10-CM

## 2016-11-04 ENCOUNTER — Encounter (HOSPITAL_BASED_OUTPATIENT_CLINIC_OR_DEPARTMENT_OTHER): Payer: Self-pay | Admitting: *Deleted

## 2016-11-05 ENCOUNTER — Other Ambulatory Visit: Payer: Self-pay | Admitting: Otolaryngology

## 2016-11-10 ENCOUNTER — Ambulatory Visit (HOSPITAL_BASED_OUTPATIENT_CLINIC_OR_DEPARTMENT_OTHER)
Admission: RE | Admit: 2016-11-10 | Discharge: 2016-11-10 | Disposition: A | Discharge status: Home / Self Care | Payer: BC Managed Care – PPO | Source: Ambulatory Visit | Attending: Otolaryngology | Admitting: Otolaryngology

## 2016-11-10 ENCOUNTER — Ambulatory Visit (HOSPITAL_BASED_OUTPATIENT_CLINIC_OR_DEPARTMENT_OTHER): Payer: BC Managed Care – PPO | Admitting: Anesthesiology

## 2016-11-10 ENCOUNTER — Encounter (HOSPITAL_BASED_OUTPATIENT_CLINIC_OR_DEPARTMENT_OTHER): Payer: Self-pay | Admitting: *Deleted

## 2016-11-10 ENCOUNTER — Encounter (HOSPITAL_BASED_OUTPATIENT_CLINIC_OR_DEPARTMENT_OTHER)
Admission: RE | Disposition: A | Discharge status: Home / Self Care | Payer: Self-pay | Source: Ambulatory Visit | Attending: Otolaryngology

## 2016-11-10 DIAGNOSIS — J353 Hypertrophy of tonsils with hypertrophy of adenoids: Secondary | ICD-10-CM | POA: Diagnosis not present

## 2016-11-10 DIAGNOSIS — J3501 Chronic tonsillitis: Secondary | ICD-10-CM | POA: Diagnosis not present

## 2016-11-10 DIAGNOSIS — K279 Peptic ulcer, site unspecified, unspecified as acute or chronic, without hemorrhage or perforation: Secondary | ICD-10-CM | POA: Diagnosis not present

## 2016-11-10 DIAGNOSIS — K5190 Ulcerative colitis, unspecified, without complications: Secondary | ICD-10-CM | POA: Diagnosis not present

## 2016-11-10 DIAGNOSIS — J3503 Chronic tonsillitis and adenoiditis: Secondary | ICD-10-CM | POA: Diagnosis not present

## 2016-11-10 HISTORY — PX: TONSILLECTOMY AND ADENOIDECTOMY: SHX28

## 2016-11-10 SURGERY — TONSILLECTOMY AND ADENOIDECTOMY
Anesthesia: General

## 2016-11-10 MED ORDER — DEXAMETHASONE SODIUM PHOSPHATE 4 MG/ML IJ SOLN
INTRAMUSCULAR | Status: DC | PRN
  Administered 2016-11-10: 10 mg via INTRAVENOUS

## 2016-11-10 MED ORDER — LACTATED RINGERS IV SOLN
INTRAVENOUS | Status: DC
  Administered 2016-11-10: 10:00:00 via INTRAVENOUS
  Administered 2016-11-10: 10 mL/h via INTRAVENOUS

## 2016-11-10 MED ORDER — SCOPOLAMINE 1 MG/3DAYS TD PT72: 1.0000 | MEDICATED_PATCH | Freq: Once | TRANSDERMAL | Status: DC | PRN

## 2016-11-10 MED ORDER — FENTANYL CITRATE (PF) 100 MCG/2ML IJ SOLN
INTRAMUSCULAR | Status: AC
  Filled 2016-11-10: qty 2

## 2016-11-10 MED ORDER — MIDAZOLAM HCL 2 MG/2ML IJ SOLN
1.0000 mg | INTRAMUSCULAR | Status: DC | PRN
  Administered 2016-11-10: 2 mg via INTRAVENOUS

## 2016-11-10 MED ORDER — ONDANSETRON HCL 4 MG/2ML IJ SOLN
INTRAMUSCULAR | Status: DC | PRN
  Administered 2016-11-10: 4 mg via INTRAVENOUS

## 2016-11-10 MED ORDER — PROPOFOL 10 MG/ML IV BOLUS
INTRAVENOUS | Status: AC
  Filled 2016-11-10: qty 20

## 2016-11-10 MED ORDER — LIDOCAINE 2% (20 MG/ML) 5 ML SYRINGE
INTRAMUSCULAR | Status: DC | PRN
  Administered 2016-11-10: 40 mg via INTRAVENOUS

## 2016-11-10 MED ORDER — AMOXICILLIN 400 MG/5ML PO SUSR: 800.0000 mg | Freq: Two times a day (BID) | ORAL | 0 refills | Status: AC

## 2016-11-10 MED ORDER — ONDANSETRON HCL 4 MG/2ML IJ SOLN
INTRAMUSCULAR | Status: AC
  Filled 2016-11-10: qty 2

## 2016-11-10 MED ORDER — DEXAMETHASONE SODIUM PHOSPHATE 10 MG/ML IJ SOLN
INTRAMUSCULAR | Status: AC
  Filled 2016-11-10: qty 1

## 2016-11-10 MED ORDER — LIDOCAINE 2% (20 MG/ML) 5 ML SYRINGE
INTRAMUSCULAR | Status: AC
  Filled 2016-11-10: qty 5

## 2016-11-10 MED ORDER — PROPOFOL 10 MG/ML IV BOLUS
INTRAVENOUS | Status: DC | PRN
  Administered 2016-11-10: 30 mg via INTRAVENOUS
  Administered 2016-11-10: 170 mg via INTRAVENOUS

## 2016-11-10 MED ORDER — BACITRACIN 500 UNIT/GM EX OINT
TOPICAL_OINTMENT | CUTANEOUS | Status: DC | PRN
  Administered 2016-11-10: 1 via TOPICAL

## 2016-11-10 MED ORDER — OXYCODONE HCL 5 MG/5ML PO SOLN: 5.0000 mg | Freq: Once | ORAL | Status: DC

## 2016-11-10 MED ORDER — MIDAZOLAM HCL 2 MG/2ML IJ SOLN
INTRAMUSCULAR | Status: AC
  Filled 2016-11-10: qty 2

## 2016-11-10 MED ORDER — HYDROMORPHONE HCL 1 MG/ML IJ SOLN
INTRAMUSCULAR | Status: AC
  Filled 2016-11-10: qty 1

## 2016-11-10 MED ORDER — SODIUM CHLORIDE 0.9 % IR SOLN
Status: DC | PRN
  Administered 2016-11-10: 1

## 2016-11-10 MED ORDER — FENTANYL CITRATE (PF) 100 MCG/2ML IJ SOLN
50.0000 ug | INTRAMUSCULAR | Status: DC | PRN
  Administered 2016-11-10: 100 ug via INTRAVENOUS

## 2016-11-10 MED ORDER — OXYCODONE HCL 5 MG/5ML PO SOLN: 5.0000 mg | ORAL | 0 refills | Status: DC | PRN

## 2016-11-10 MED ORDER — SUCCINYLCHOLINE CHLORIDE 200 MG/10ML IV SOSY
PREFILLED_SYRINGE | INTRAVENOUS | Status: DC | PRN
  Administered 2016-11-10: 100 mg via INTRAVENOUS

## 2016-11-10 MED ORDER — HYDROMORPHONE HCL 1 MG/ML IJ SOLN
0.2500 mg | INTRAMUSCULAR | Status: DC | PRN
  Administered 2016-11-10: 0.25 mg via INTRAVENOUS

## 2016-11-10 SURGICAL SUPPLY — 32 items
BANDAGE COBAN STERILE 2 (GAUZE/BANDAGES/DRESSINGS) ×3 IMPLANT
CANISTER SUCT 1200ML W/VALVE (MISCELLANEOUS) ×3 IMPLANT
CATH ROBINSON RED A/P 10FR (CATHETERS) IMPLANT
CATH ROBINSON RED A/P 14FR (CATHETERS) ×3 IMPLANT
COAGULATOR SUCT 6 FR SWTCH (ELECTROSURGICAL)
COAGULATOR SUCT SWTCH 10FR 6 (ELECTROSURGICAL) IMPLANT
COVER MAYO STAND STRL (DRAPES) ×3 IMPLANT
ELECT REM PT RETURN 9FT ADLT (ELECTROSURGICAL) ×3
ELECT REM PT RETURN 9FT PED (ELECTROSURGICAL)
ELECTRODE REM PT RETRN 9FT PED (ELECTROSURGICAL) IMPLANT
ELECTRODE REM PT RTRN 9FT ADLT (ELECTROSURGICAL) ×1 IMPLANT
GLOVE BIO SURGEON STRL SZ 6.5 (GLOVE) ×2 IMPLANT
GLOVE BIO SURGEON STRL SZ7.5 (GLOVE) ×3 IMPLANT
GLOVE BIO SURGEONS STRL SZ 6.5 (GLOVE) ×1
GOWN STRL REUS W/ TWL LRG LVL3 (GOWN DISPOSABLE) ×2 IMPLANT
GOWN STRL REUS W/TWL LRG LVL3 (GOWN DISPOSABLE) ×4
IV NS 500ML (IV SOLUTION) ×2
IV NS 500ML BAXH (IV SOLUTION) ×1 IMPLANT
MARKER SKIN DUAL TIP RULER LAB (MISCELLANEOUS) IMPLANT
NS IRRIG 1000ML POUR BTL (IV SOLUTION) IMPLANT
SHEET MEDIUM DRAPE 40X70 STRL (DRAPES) ×3 IMPLANT
SOLUTION BUTLER CLEAR DIP (MISCELLANEOUS) ×3 IMPLANT
SPONGE GAUZE 4X4 12PLY STER LF (GAUZE/BANDAGES/DRESSINGS) ×3 IMPLANT
SPONGE TONSIL 1 RF SGL (DISPOSABLE) IMPLANT
SPONGE TONSIL 1.25 RF SGL STRG (GAUZE/BANDAGES/DRESSINGS) ×3 IMPLANT
SYR BULB 3OZ (MISCELLANEOUS) IMPLANT
TOWEL OR 17X24 6PK STRL BLUE (TOWEL DISPOSABLE) ×3 IMPLANT
TUBE CONNECTING 20'X1/4 (TUBING) ×1
TUBE CONNECTING 20X1/4 (TUBING) ×2 IMPLANT
TUBE SALEM SUMP 12R W/ARV (TUBING) IMPLANT
TUBE SALEM SUMP 16 FR W/ARV (TUBING) ×3 IMPLANT
WAND COBLATOR 70 EVAC XTRA (SURGICAL WAND) ×3 IMPLANT

## 2016-11-10 NOTE — Anesthesia Procedure Notes (Signed)
Procedure Name: Intubation Date/Time: 11/10/2016 9:52 AM Performed by: Lyndee Leo Pre-anesthesia Checklist: Patient identified, Emergency Drugs available, Suction available and Patient being monitored Patient Re-evaluated:Patient Re-evaluated prior to inductionOxygen Delivery Method: Circle system utilized Preoxygenation: Pre-oxygenation with 100% oxygen Intubation Type: IV induction Ventilation: Mask ventilation without difficulty Laryngoscope Size: Miller and 2 Tube type: Oral Tube size: 7.0 mm Number of attempts: 1 Airway Equipment and Method: Stylet and Oral airway Placement Confirmation: ETT inserted through vocal cords under direct vision,  positive ETCO2 and breath sounds checked- equal and bilateral Tube secured with: Tape Dental Injury: Teeth and Oropharynx as per pre-operative assessment

## 2016-11-10 NOTE — Discharge Instructions (Addendum)
[facility] Postoperative Instructions for Tonsillectomy & Adenoidectomy (T&A) Activity Restrict activity at home for the first two days, resting as much as possible. Light indoor activity is best. You may usually return to school or work within a week but void strenuous activity and sports for two weeks. Sleep with your head elevated on 2-3 pillows for 3-4 days to help decrease swelling. Diet Due to tissue swelling and throat discomfort, you may have little desire to drink for several days. However fluids are very important to prevent dehydration. You will find that non-acidic juices, soups, popsicles, Jell-O, custard, puddings, and any soft or mashed foods taken in small quantities can be swallowed fairly easily. Try to increase your fluid and food intake as the discomfort subsides. It is recommended that a child receive 1-1/2 quarts of fluid in a 24-hour period. Adult require twice this amount.  Discomfort Your sore throat may be relieved by applying an ice collar to your neck and/or by taking Tylenol. You may experience an earache, which is due to referred pain from the throat. Referred ear pain is commonly felt at night when trying to rest.  Bleeding                        Although rare, there is risk of having some bleeding during the first 2 weeks after having a T&A. This usually happens between days 7-10 postoperatively. If you or your child should have any bleeding, try to remain calm. We recommend sitting up quietly in a chair and gently spitting out the blood into a bowl. For adults, gargling gently with ice water may help. If the bleeding does not stop after a short time (5 minutes), is more than 1 teaspoonful, or if you become worried, please call our office at [phone] or go directly to the nearest hospital emergency room. Do not eat or drink anything prior to going to the hospital as you may need to be taken to the operating room in order to control the bleeding. GENERAL  CONSIDERATIONS 1. Brush your teeth regularly. Avoid mouthwashes and gargles for three weeks. You may gargle gently with warm salt-water as necessary or spray with Chloraseptic. You may make salt-water by placing 2 teaspoons of table salt into a quart of fresh water. Warm the salt-water in a microwave to a luke warm temperature.  2. Avoid exposure to colds and upper respiratory infections if possible.  3. If you look into a mirror or into your child's mouth, you will see white-gray patches in the back of the throat. This is normal after having a T&A and is like a scab that forms on the skin after an abrasion. It will disappear once the back of the throat heals completely. However, it may cause a noticeable odor; this too will disappear with time. Again, warm salt-water gargles may be used to help keep the throat clean and promote healing.  4. You may notice a temporary change in voice quality, such as a higher pitched voice or a nasal sound, until healing is complete. This may last for 1-2 weeks and should resolve.  5. Do not take or give you child any medications that we have not prescribed or recommended.  6. Snoring may occur, especially at night, for the first week after a T&A. It is due to swelling of the soft palate and will usually resolve.  Please call our office at [phone] if you have any questions.  Post Anesthesia Home Care Instructions  Activity: Get plenty of rest for the remainder of the day. A responsible adult should stay with you for 24 hours following the procedure.  For the next 24 hours, DO NOT: -Drive a car -Paediatric nurse -Drink alcoholic beverages -Take any medication unless instructed by your physician -Make any legal decisions or sign important papers.  Meals: Start with liquid foods such as gelatin or soup. Progress to regular foods as tolerated. Avoid greasy, spicy, heavy foods. If nausea and/or vomiting occur, drink only clear liquids until the nausea  and/or vomiting subsides. Call your physician if vomiting continues.  Special Instructions/Symptoms: Your throat may feel dry or sore from the anesthesia or the breathing tube placed in your throat during surgery. If this causes discomfort, gargle with warm salt water. The discomfort should disappear within 24 hours.  If you had a scopolamine patch placed behind your ear for the management of post- operative nausea and/or vomiting:  1. The medication in the patch is effective for 72 hours, after which it should be removed.  Wrap patch in a tissue and discard in the trash. Wash hands thoroughly with soap and water. 2. You may remove the patch earlier than 72 hours if you experience unpleasant side effects which may include dry mouth, dizziness or visual disturbances. 3. Avoid touching the patch. Wash your hands with soap and water after contact with the patch.

## 2016-11-10 NOTE — H&P (Signed)
Cc: Chronic sore throat, tonsil stones  HPI: The patient is a 51 year old female who returns today complaining of persistent throat discomfort. She was last seen 3 weeks ago. At that time, the patient was noted to have multiple small tonsilliths within the right tonsillar fossa with an otherwise normal laryngoscopy examination.  Observation and the use of a waterpik was recommended. The patient returns today with complaining of persistent right sided throat discomfort. She has not slept well for the past several weeks because her throat is bothering her. She tried using the waterpik with no relief. No other ENT, GI, or respiratory issue noted since the last visit.   Objective General: Communicates without difficulty, well nourished, no acute distress. Head: Normocephalic, no evidence injury, no tenderness, facial buttresses intact without stepoff. Face/sinus: No tenderness to palpation and percussion. Facial movement is normal and symmetric. Eyes: PERRL, EOMI. No scleral icterus, conjunctivae clear. Neuro: CN II exam reveals vision grossly intact. No nystagmus at any point of gaze. Ears: Auricles well formed without lesions. Ear canals are intact without mass or lesion. No erythema or edema is appreciated. The TMs are intact without fluid. Nose: External evaluation reveals normal support and skin without lesions. Dorsum is intact. Anterior rhinoscopy reveals healthy pink mucosa over anterior aspect of inferior turbinates and intact septum. No purulence noted. Oral:  Oral cavity and oropharynx are intact, symmetric, without erythema or edema. Mucosa is moist without lesions. 1+ cryptic tonsils bilaterally. No acute infection is noted today. Multiple small tonsilliths are noted within the right tonsillar fossa. Neck: Full range of motion without pain. There is no significant lymphadenopathy. No masses palpable. Thyroid bed within normal limits to palpation. Parotid glands and submandibular glands equal  bilaterally without mass. Trachea is midline. Neuro:  CN 2-12 grossly intact. Gait normal.   Assessment 1. The patient is noted to have 1+ cryptic tonsils bilaterally. Multiple small tonsilliths are noted within the right tonsillar fossa.   Plan Print Visit Summary 1. The physical exam findings are reviewed with the patient.  2. The option of tonsillectomy is discussed with the patient. The risks, benefits, alternatives and details of the procedure are reviewed. 3. The patient is interested in proceeding with the procedure.  We will schedule the procedure in accordance with the family schedule.

## 2016-11-10 NOTE — Anesthesia Preprocedure Evaluation (Signed)
Anesthesia Evaluation  Patient identified by MRN, date of birth, ID band Patient awake    Reviewed: Allergy & Precautions, NPO status , Patient's Chart, lab work & pertinent test results  Airway Mallampati: II  TM Distance: >3 FB     Dental   Pulmonary    breath sounds clear to auscultation       Cardiovascular negative cardio ROS   Rhythm:Regular Rate:Normal     Neuro/Psych    GI/Hepatic Neg liver ROS, PUD, History of ulcerative colitis which has been stable. CG   Endo/Other  negative endocrine ROS  Renal/GU negative Renal ROS     Musculoskeletal   Abdominal   Peds  Hematology   Anesthesia Other Findings   Reproductive/Obstetrics                             Anesthesia Physical Anesthesia Plan  ASA: III  Anesthesia Plan: General   Post-op Pain Management:    Induction: Intravenous  Airway Management Planned: Oral ETT  Additional Equipment:   Intra-op Plan:   Post-operative Plan: Extubation in OR  Informed Consent: I have reviewed the patients History and Physical, chart, labs and discussed the procedure including the risks, benefits and alternatives for the proposed anesthesia with the patient or authorized representative who has indicated his/her understanding and acceptance.   Dental advisory given  Plan Discussed with: CRNA and Anesthesiologist  Anesthesia Plan Comments:         Anesthesia Quick Evaluation

## 2016-11-10 NOTE — Anesthesia Postprocedure Evaluation (Signed)
Anesthesia Post Note  Patient: Sandra Crane  Procedure(s) Performed: Procedure(s) (LRB): TONSILLECTOMY AND ADENOIDECTOMY (N/A)  Patient location during evaluation: PACU Anesthesia Type: General Level of consciousness: awake Vital Signs Assessment: post-procedure vital signs reviewed and stable Respiratory status: spontaneous breathing Cardiovascular status: stable Anesthetic complications: no       Last Vitals:  Vitals:   11/10/16 1024 11/10/16 1030  BP: 124/88 124/80  Pulse: (!) 108 91  Resp: (!) 29 17  Temp: 36.9 C     Last Pain:  Vitals:   11/10/16 1024  TempSrc:   PainSc: 0-No pain                 Andrzej Scully

## 2016-11-10 NOTE — Op Note (Signed)
DATE OF PROCEDURE:  11/10/2016                              OPERATIVE REPORT  SURGEON:  Leta Baptist, MD  PREOPERATIVE DIAGNOSES: 1. Adenotonsillar hypertrophy. 2. Chronic tonsillitis and pharyngitis  POSTOPERATIVE DIAGNOSES: 1. Adenotonsillar hypertrophy. 2. Chronic tonsillitis and pharyngitis  PROCEDURE PERFORMED:  Adenotonsillectomy.  ANESTHESIA:  General endotracheal tube anesthesia.  COMPLICATIONS:  None.  ESTIMATED BLOOD LOSS:  Minimal.  INDICATION FOR PROCEDURE:  Sandra Crane is a 51 y.o. female with a history of chronic tonsillitis/pharyngitis and throat discomfort.  According to the patient, she has been experiencing chronic throat discomfort for more than 3 months. The patient continues to be symptomatic despite medical treatments. On examination, the patient was noted to have bilateral cryptic tonsils, with numerous tonsilloliths. Based on the above findings, the decision was made for the patient to undergo the adenotonsillectomy procedure. Likelihood of success in reducing symptoms was also discussed.  The risks, benefits, alternatives, and details of the procedure were discussed with the patient.  Questions were invited and answered.  Informed consent was obtained.  DESCRIPTION:  The patient was taken to the operating room and placed supine on the operating table.  General endotracheal tube anesthesia was administered by the anesthesiologist.  The patient was positioned and prepped and draped in a standard fashion for adenotonsillectomy.  A Crowe-Davis mouth gag was inserted into the oral cavity for exposure. 1+ cryptic tonsils were noted bilaterally.  No bifidity was noted.  Indirect mirror examination of the nasopharynx revealed mild adenoid hypertrophy. The adenoid was ablated with the Coblator device. Hemostasis was achieved with the Coblator device.  The right tonsil was then grasped with a straight Allis clamp and retracted medially.  It was resected free from the underlying  pharyngeal constrictor muscles with the Coblator device.  The same procedure was repeated on the left side without exception.  The surgical sites were copiously irrigated.  The mouth gag was removed.  The care of the patient was turned over to the anesthesiologist.  The patient was awakened from anesthesia without difficulty.  The patient was extubated and transferred to the recovery room in good condition.  OPERATIVE FINDINGS:  Adenotonsillar hypertrophy.  SPECIMEN:  Bilateral tonsils  FOLLOWUP CARE:  The patient will be discharged home once awake and alert.  She will be placed on amoxicillin 800 mg p.o. b.i.d. for 5 days, and oxycodone 5-56m po q 4 hours for postop pain control.   The patient will follow up in my office in approximately 2 weeks.  [name]1/12/2016 10:23 AM

## 2016-11-10 NOTE — Transfer of Care (Signed)
Immediate Anesthesia Transfer of Care Note  Patient: Sandra Crane  Procedure(s) Performed: Procedure(s): TONSILLECTOMY AND ADENOIDECTOMY (N/A)  Patient Location: PACU  Anesthesia Type:General  Level of Consciousness: awake, sedated and patient cooperative  Airway & Oxygen Therapy: Patient Spontanous Breathing and Patient connected to face mask oxygen  Post-op Assessment: Report given to RN and Post -op Vital signs reviewed and stable  Post vital signs: Reviewed and stable  Last Vitals:  Vitals:   11/10/16 0854  BP: 124/79  Pulse: 69  Resp: 18  Temp: 37 C    Last Pain:  Vitals:   11/10/16 0854  TempSrc: Oral         Complications: No apparent anesthesia complications

## 2016-11-11 ENCOUNTER — Encounter (HOSPITAL_BASED_OUTPATIENT_CLINIC_OR_DEPARTMENT_OTHER): Payer: Self-pay | Admitting: Otolaryngology

## 2016-11-20 ENCOUNTER — Other Ambulatory Visit (INDEPENDENT_AMBULATORY_CARE_PROVIDER_SITE_OTHER): Payer: Self-pay | Admitting: Internal Medicine

## 2016-11-23 ENCOUNTER — Ambulatory Visit (INDEPENDENT_AMBULATORY_CARE_PROVIDER_SITE_OTHER): Payer: BC Managed Care – PPO | Admitting: Otolaryngology

## 2016-12-03 ENCOUNTER — Encounter (INDEPENDENT_AMBULATORY_CARE_PROVIDER_SITE_OTHER): Payer: Self-pay | Admitting: *Deleted

## 2016-12-03 ENCOUNTER — Other Ambulatory Visit (INDEPENDENT_AMBULATORY_CARE_PROVIDER_SITE_OTHER): Payer: Self-pay | Admitting: *Deleted

## 2016-12-03 DIAGNOSIS — R945 Abnormal results of liver function studies: Secondary | ICD-10-CM

## 2016-12-03 DIAGNOSIS — R7989 Other specified abnormal findings of blood chemistry: Secondary | ICD-10-CM

## 2016-12-03 DIAGNOSIS — K5180 Other ulcerative colitis without complications: Secondary | ICD-10-CM

## 2016-12-08 ENCOUNTER — Other Ambulatory Visit (INDEPENDENT_AMBULATORY_CARE_PROVIDER_SITE_OTHER): Payer: Self-pay | Admitting: Internal Medicine

## 2016-12-08 ENCOUNTER — Encounter: Payer: Self-pay | Admitting: Internal Medicine

## 2016-12-08 DIAGNOSIS — K5120 Ulcerative (chronic) proctitis without complications: Secondary | ICD-10-CM

## 2016-12-30 LAB — CBC WITH DIFFERENTIAL/PLATELET
BASOS PCT: 1 %
Basophils Absolute: 57 cells/uL (ref 0–200)
EOS PCT: 1 %
Eosinophils Absolute: 57 cells/uL (ref 15–500)
HCT: 41.1 % (ref 35.0–45.0)
HEMOGLOBIN: 13.4 g/dL (ref 11.7–15.5)
LYMPHS ABS: 1140 {cells}/uL (ref 850–3900)
Lymphocytes Relative: 20 %
MCH: 32.1 pg (ref 27.0–33.0)
MCHC: 32.6 g/dL (ref 32.0–36.0)
MCV: 98.3 fL (ref 80.0–100.0)
MPV: 9.1 fL (ref 7.5–12.5)
Monocytes Absolute: 855 cells/uL (ref 200–950)
Monocytes Relative: 15 %
NEUTROS ABS: 3591 {cells}/uL (ref 1500–7800)
Neutrophils Relative %: 63 %
Platelets: 306 10*3/uL (ref 140–400)
RBC: 4.18 MIL/uL (ref 3.80–5.10)
RDW: 14 % (ref 11.0–15.0)
WBC: 5.7 10*3/uL (ref 3.8–10.8)

## 2016-12-30 LAB — HEPATIC FUNCTION PANEL
ALT: 12 U/L (ref 6–29)
AST: 15 U/L (ref 10–35)
Albumin: 4.2 g/dL (ref 3.6–5.1)
Alkaline Phosphatase: 61 U/L (ref 33–130)
BILIRUBIN DIRECT: 0.1 mg/dL (ref <=0.2)
BILIRUBIN TOTAL: 0.3 mg/dL (ref 0.2–1.2)
Indirect Bilirubin: 0.2 mg/dL (ref 0.2–1.2)
Total Protein: 6.7 g/dL (ref 6.1–8.1)

## 2016-12-31 ENCOUNTER — Other Ambulatory Visit (INDEPENDENT_AMBULATORY_CARE_PROVIDER_SITE_OTHER): Payer: Self-pay | Admitting: *Deleted

## 2016-12-31 DIAGNOSIS — K5190 Ulcerative colitis, unspecified, without complications: Secondary | ICD-10-CM

## 2016-12-31 DIAGNOSIS — R7989 Other specified abnormal findings of blood chemistry: Secondary | ICD-10-CM

## 2016-12-31 DIAGNOSIS — R945 Abnormal results of liver function studies: Secondary | ICD-10-CM

## 2017-01-04 ENCOUNTER — Ambulatory Visit (INDEPENDENT_AMBULATORY_CARE_PROVIDER_SITE_OTHER): Payer: BC Managed Care – PPO | Admitting: Otolaryngology

## 2017-04-01 ENCOUNTER — Other Ambulatory Visit (INDEPENDENT_AMBULATORY_CARE_PROVIDER_SITE_OTHER): Payer: Self-pay | Admitting: *Deleted

## 2017-04-01 ENCOUNTER — Encounter (INDEPENDENT_AMBULATORY_CARE_PROVIDER_SITE_OTHER): Payer: Self-pay | Admitting: *Deleted

## 2017-04-01 DIAGNOSIS — K5190 Ulcerative colitis, unspecified, without complications: Secondary | ICD-10-CM

## 2017-04-01 DIAGNOSIS — R945 Abnormal results of liver function studies: Secondary | ICD-10-CM

## 2017-04-01 DIAGNOSIS — R7989 Other specified abnormal findings of blood chemistry: Secondary | ICD-10-CM

## 2017-04-01 MED ORDER — SIMVASTATIN 20 MG PO TABS: 20.0000 mg | ORAL_TABLET | Freq: Every day | ORAL | 5 refills | Status: DC

## 2017-04-01 NOTE — Telephone Encounter (Signed)
Patient's insurance will not be covering the Crestor this year. After reviewing with Dr.Rehman he ask that I call in Simvastatin 20 mg - the patient will take 1 by mouth daily. #30 with 5 refills.  This was e-scribed to [facility] in [place]. They will contact the patient.

## 2017-04-15 ENCOUNTER — Other Ambulatory Visit: Payer: Self-pay | Admitting: Adult Health

## 2017-04-15 DIAGNOSIS — Z1231 Encounter for screening mammogram for malignant neoplasm of breast: Secondary | ICD-10-CM

## 2017-04-29 LAB — CBC
HCT: 42.4 % (ref 35.0–45.0)
HEMOGLOBIN: 14.3 g/dL (ref 11.7–15.5)
MCH: 33.4 pg — AB (ref 27.0–33.0)
MCHC: 33.7 g/dL (ref 32.0–36.0)
MCV: 99.1 fL (ref 80.0–100.0)
MPV: 9.1 fL (ref 7.5–12.5)
Platelets: 281 10*3/uL (ref 140–400)
RBC: 4.28 MIL/uL (ref 3.80–5.10)
RDW: 13.5 % (ref 11.0–15.0)
WBC: 4 10*3/uL (ref 3.8–10.8)

## 2017-04-29 LAB — HEPATIC FUNCTION PANEL
ALBUMIN: 4.3 g/dL (ref 3.6–5.1)
ALT: 14 U/L (ref 6–29)
AST: 13 U/L (ref 10–35)
Alkaline Phosphatase: 48 U/L (ref 33–130)
BILIRUBIN TOTAL: 0.6 mg/dL (ref 0.2–1.2)
Bilirubin, Direct: 0.1 mg/dL (ref <=0.2)
Indirect Bilirubin: 0.5 mg/dL (ref 0.2–1.2)
Total Protein: 6.8 g/dL (ref 6.1–8.1)

## 2017-05-03 ENCOUNTER — Other Ambulatory Visit (INDEPENDENT_AMBULATORY_CARE_PROVIDER_SITE_OTHER): Payer: Self-pay | Admitting: *Deleted

## 2017-05-03 DIAGNOSIS — K5180 Other ulcerative colitis without complications: Secondary | ICD-10-CM

## 2017-05-03 DIAGNOSIS — R945 Abnormal results of liver function studies: Secondary | ICD-10-CM

## 2017-05-03 DIAGNOSIS — E7849 Other hyperlipidemia: Secondary | ICD-10-CM

## 2017-05-03 DIAGNOSIS — R7989 Other specified abnormal findings of blood chemistry: Secondary | ICD-10-CM

## 2017-05-21 ENCOUNTER — Ambulatory Visit
Admission: RE | Admit: 2017-05-21 | Discharge: 2017-05-21 | Disposition: A | Discharge status: Home / Self Care | Payer: BC Managed Care – PPO | Source: Ambulatory Visit | Attending: Adult Health | Admitting: Adult Health

## 2017-05-21 DIAGNOSIS — Z1231 Encounter for screening mammogram for malignant neoplasm of breast: Secondary | ICD-10-CM

## 2017-08-05 ENCOUNTER — Encounter (INDEPENDENT_AMBULATORY_CARE_PROVIDER_SITE_OTHER): Payer: Self-pay | Admitting: *Deleted

## 2017-08-05 ENCOUNTER — Other Ambulatory Visit (INDEPENDENT_AMBULATORY_CARE_PROVIDER_SITE_OTHER): Payer: Self-pay | Admitting: *Deleted

## 2017-08-05 DIAGNOSIS — R7989 Other specified abnormal findings of blood chemistry: Secondary | ICD-10-CM

## 2017-08-05 DIAGNOSIS — E7849 Other hyperlipidemia: Secondary | ICD-10-CM

## 2017-08-05 DIAGNOSIS — R945 Abnormal results of liver function studies: Secondary | ICD-10-CM

## 2017-08-05 DIAGNOSIS — K5180 Other ulcerative colitis without complications: Secondary | ICD-10-CM

## 2017-09-02 LAB — HEPATIC FUNCTION PANEL
AG Ratio: 1.6 (calc) (ref 1.0–2.5)
ALBUMIN MSPROF: 4.6 g/dL (ref 3.6–5.1)
ALT: 9 U/L (ref 6–29)
AST: 14 U/L (ref 10–35)
Alkaline phosphatase (APISO): 44 U/L (ref 33–130)
BILIRUBIN DIRECT: 0.1 mg/dL (ref 0.0–0.2)
BILIRUBIN INDIRECT: 0.6 mg/dL (ref 0.2–1.2)
Globulin: 2.8 g/dL (calc) (ref 1.9–3.7)
Total Bilirubin: 0.7 mg/dL (ref 0.2–1.2)
Total Protein: 7.4 g/dL (ref 6.1–8.1)

## 2017-09-02 LAB — CBC WITH DIFFERENTIAL/PLATELET
BASOS ABS: 31 {cells}/uL (ref 0–200)
Basophils Relative: 0.8 %
EOS PCT: 0.3 %
Eosinophils Absolute: 12 cells/uL — ABNORMAL LOW (ref 15–500)
HCT: 42.1 % (ref 35.0–45.0)
Hemoglobin: 14.5 g/dL (ref 11.7–15.5)
Lymphs Abs: 503 cells/uL — ABNORMAL LOW (ref 850–3900)
MCH: 33.3 pg — ABNORMAL HIGH (ref 27.0–33.0)
MCHC: 34.4 g/dL (ref 32.0–36.0)
MCV: 96.6 fL (ref 80.0–100.0)
MPV: 9.5 fL (ref 7.5–12.5)
Monocytes Relative: 16.5 %
NEUTROS PCT: 69.5 %
Neutro Abs: 2711 cells/uL (ref 1500–7800)
PLATELETS: 264 10*3/uL (ref 140–400)
RBC: 4.36 10*6/uL (ref 3.80–5.10)
RDW: 12.3 % (ref 11.0–15.0)
Total Lymphocyte: 12.9 %
WBC mixed population: 644 cells/uL (ref 200–950)
WBC: 3.9 10*3/uL (ref 3.8–10.8)

## 2017-09-02 LAB — LIPID PANEL
CHOL/HDL RATIO: 3.8 (calc) (ref <5.0)
Cholesterol: 237 mg/dL — ABNORMAL HIGH (ref <200)
HDL: 63 mg/dL (ref >50)
LDL CHOLESTEROL (CALC): 150 mg/dL — AB
Non-HDL Cholesterol (Calc): 174 mg/dL (calc) — ABNORMAL HIGH (ref <130)
Triglycerides: 121 mg/dL (ref <150)

## 2017-09-07 ENCOUNTER — Other Ambulatory Visit (INDEPENDENT_AMBULATORY_CARE_PROVIDER_SITE_OTHER): Payer: Self-pay | Admitting: Internal Medicine

## 2017-09-07 MED ORDER — SIMVASTATIN 20 MG PO TABS: 40.0000 mg | ORAL_TABLET | Freq: Every day | ORAL | 5 refills | Status: DC

## 2017-09-08 ENCOUNTER — Other Ambulatory Visit (INDEPENDENT_AMBULATORY_CARE_PROVIDER_SITE_OTHER): Payer: Self-pay | Admitting: *Deleted

## 2017-09-08 DIAGNOSIS — R7989 Other specified abnormal findings of blood chemistry: Secondary | ICD-10-CM

## 2017-09-08 DIAGNOSIS — R945 Abnormal results of liver function studies: Secondary | ICD-10-CM

## 2017-09-09 ENCOUNTER — Other Ambulatory Visit (INDEPENDENT_AMBULATORY_CARE_PROVIDER_SITE_OTHER): Payer: Self-pay | Admitting: Internal Medicine

## 2017-09-29 ENCOUNTER — Encounter: Payer: Self-pay | Admitting: Adult Health

## 2017-09-29 ENCOUNTER — Ambulatory Visit (INDEPENDENT_AMBULATORY_CARE_PROVIDER_SITE_OTHER): Payer: BC Managed Care – PPO | Admitting: Adult Health

## 2017-09-29 VITALS — BP 100/80 | HR 76 | Ht 63.0 in | Wt 145.0 lb

## 2017-09-29 DIAGNOSIS — Z1212 Encounter for screening for malignant neoplasm of rectum: Secondary | ICD-10-CM

## 2017-09-29 DIAGNOSIS — R058 Other specified cough: Secondary | ICD-10-CM | POA: Insufficient documentation

## 2017-09-29 DIAGNOSIS — Z1211 Encounter for screening for malignant neoplasm of colon: Secondary | ICD-10-CM

## 2017-09-29 DIAGNOSIS — N951 Menopausal and female climacteric states: Secondary | ICD-10-CM | POA: Insufficient documentation

## 2017-09-29 DIAGNOSIS — Z01419 Encounter for gynecological examination (general) (routine) without abnormal findings: Secondary | ICD-10-CM | POA: Insufficient documentation

## 2017-09-29 DIAGNOSIS — R05 Cough: Secondary | ICD-10-CM | POA: Insufficient documentation

## 2017-09-29 DIAGNOSIS — Z3042 Encounter for surveillance of injectable contraceptive: Secondary | ICD-10-CM | POA: Insufficient documentation

## 2017-09-29 LAB — HEMOCCULT GUIAC POC 1CARD (OFFICE): FECAL OCCULT BLD: NEGATIVE

## 2017-09-29 MED ORDER — BENZONATATE 200 MG PO CAPS: 200.0000 mg | ORAL_CAPSULE | Freq: Three times a day (TID) | ORAL | 1 refills | Status: DC | PRN

## 2017-09-29 NOTE — Progress Notes (Signed)
Patient ID: Sandra Crane, female   DOB: [birth year], 51 y.o.   MRN: [id number] History of Present Illness: Sandra Crane is a 51 year old white female, married in for well woman gyn exam,she had a normal pap with negative HPV 09/20/15.Has lost about 15 lbs and is running.    Current Medications, Allergies, Past Medical History, Past Surgical History, Family History and Social History were reviewed in [employer] record.     Review of Systems:  Patient denies any headaches, hearing loss, fatigue, blurred vision, shortness of breath, chest pain, abdominal pain, problems with bowel movements, urination, or intercourse. No joint pain or mood swings.+dry cough    Physical Exam:BP 100/80 (BP Location: Left Arm, Patient Position: Sitting, Cuff Size: Normal)   Pulse 76   Ht 5' 3"  (1.6 m)   Wt 145 lb (65.8 kg)   BMI 25.69 kg/m  General:  Well developed, well nourished, no acute distress Skin:  Warm and dry Neck:  Midline trachea, normal thyroid, good ROM, no lymphadenopathy Lungs; Clear to auscultation bilaterally Breast:  No dominant palpable mass, retraction, or nipple discharge Cardiovascular: Regular rate and rhythm Abdomen:  Soft, non tender, no hepatosplenomegaly Pelvic:  External genitalia is normal in appearance, no lesions.  The vagina is normal in appearance. Urethra has no lesions or masses. The cervix is bulbous.  Uterus is felt to be normal size, shape, and contour.  No adnexal masses or tenderness noted.Bladder is non tender, no masses felt. Rectal: Good sphincter tone, no polyps, or hemorrhoids felt.  Hemoccult negative. Extremities/musculoskeletal:  No swelling or varicosities noted, no clubbing or cyanosis Psych:  No mood changes, alert and cooperative,seems happy PHQ 2 score 0.Will stop depo and check FSH 1 month later.   Impression: 1. Well woman exam with routine gynecological exam   2. Screening for colorectal cancer   3. Encounter for surveillance  of injectable contraceptive   4. Menopausal symptoms   5. Dry cough       Plan: Rx Tessalon Perles 200 mg #20 take 1 tid prn with 1 refill Stop depo  Check FSH about 11/29/17 Pap and physical in 1 year Mammogram yearly Labs per Dr Laural Golden

## 2017-10-04 ENCOUNTER — Encounter (INDEPENDENT_AMBULATORY_CARE_PROVIDER_SITE_OTHER): Payer: Self-pay | Admitting: *Deleted

## 2017-10-04 ENCOUNTER — Other Ambulatory Visit (INDEPENDENT_AMBULATORY_CARE_PROVIDER_SITE_OTHER): Payer: Self-pay | Admitting: *Deleted

## 2017-10-04 DIAGNOSIS — R7989 Other specified abnormal findings of blood chemistry: Secondary | ICD-10-CM

## 2017-10-04 DIAGNOSIS — R945 Abnormal results of liver function studies: Secondary | ICD-10-CM

## 2017-10-11 DIAGNOSIS — H2513 Age-related nuclear cataract, bilateral: Secondary | ICD-10-CM | POA: Diagnosis not present

## 2017-10-11 DIAGNOSIS — H35413 Lattice degeneration of retina, bilateral: Secondary | ICD-10-CM | POA: Diagnosis not present

## 2017-10-11 DIAGNOSIS — H35729 Serous detachment of retinal pigment epithelium, unspecified eye: Secondary | ICD-10-CM | POA: Diagnosis not present

## 2017-10-12 ENCOUNTER — Telehealth (INDEPENDENT_AMBULATORY_CARE_PROVIDER_SITE_OTHER): Payer: Self-pay | Admitting: Internal Medicine

## 2017-10-12 ENCOUNTER — Other Ambulatory Visit (INDEPENDENT_AMBULATORY_CARE_PROVIDER_SITE_OTHER): Payer: Self-pay | Admitting: Internal Medicine

## 2017-10-12 MED ORDER — SIMVASTATIN 40 MG PO TABS: 40.0000 mg | ORAL_TABLET | Freq: Every evening | ORAL | 11 refills | Status: DC

## 2017-10-12 NOTE — Telephone Encounter (Signed)
Patient called, lmoam stating that Dr. Laural Golden prescribed Simvastatin and increased the dosage.  She is trying to get it refilled and the pharmacy is telling her it's too early.   [phone] - work

## 2017-10-12 NOTE — Telephone Encounter (Signed)
This has been addressed. Rx has been sent in

## 2017-11-05 LAB — LIPID PANEL
CHOLESTEROL: 188 mg/dL (ref <200)
HDL: 62 mg/dL (ref >50)
LDL Cholesterol (Calc): 109 mg/dL (calc) — ABNORMAL HIGH
Non-HDL Cholesterol (Calc): 126 mg/dL (calc) (ref <130)
Total CHOL/HDL Ratio: 3 (calc) (ref <5.0)
Triglycerides: 80 mg/dL (ref <150)

## 2017-11-05 LAB — HEPATIC FUNCTION PANEL
AG Ratio: 2 (calc) (ref 1.0–2.5)
ALKALINE PHOSPHATASE (APISO): 47 U/L (ref 33–130)
ALT: 11 U/L (ref 6–29)
AST: 13 U/L (ref 10–35)
Albumin: 4.1 g/dL (ref 3.6–5.1)
BILIRUBIN INDIRECT: 0.6 mg/dL (ref 0.2–1.2)
Bilirubin, Direct: 0.1 mg/dL (ref 0.0–0.2)
Globulin: 2.1 g/dL (calc) (ref 1.9–3.7)
Total Bilirubin: 0.7 mg/dL (ref 0.2–1.2)
Total Protein: 6.2 g/dL (ref 6.1–8.1)

## 2017-11-08 ENCOUNTER — Other Ambulatory Visit (INDEPENDENT_AMBULATORY_CARE_PROVIDER_SITE_OTHER): Payer: Self-pay | Admitting: *Deleted

## 2017-11-08 DIAGNOSIS — K5180 Other ulcerative colitis without complications: Secondary | ICD-10-CM

## 2017-11-08 DIAGNOSIS — R945 Abnormal results of liver function studies: Secondary | ICD-10-CM

## 2017-11-08 DIAGNOSIS — R7989 Other specified abnormal findings of blood chemistry: Secondary | ICD-10-CM

## 2017-11-10 DIAGNOSIS — J019 Acute sinusitis, unspecified: Secondary | ICD-10-CM | POA: Diagnosis not present

## 2017-11-22 DIAGNOSIS — H2513 Age-related nuclear cataract, bilateral: Secondary | ICD-10-CM | POA: Diagnosis not present

## 2017-11-22 DIAGNOSIS — H35413 Lattice degeneration of retina, bilateral: Secondary | ICD-10-CM | POA: Diagnosis not present

## 2017-11-30 LAB — FOLLICLE STIMULATING HORMONE: FSH: 7.2 m[IU]/mL

## 2017-12-01 ENCOUNTER — Telehealth: Payer: Self-pay | Admitting: Adult Health

## 2017-12-01 MED ORDER — MEDROXYPROGESTERONE ACETATE 150 MG/ML IM SUSP: INTRAMUSCULAR | 4 refills | Status: DC

## 2017-12-01 NOTE — Telephone Encounter (Signed)
Patient called stating that she would like to know the results of her blood work. Please contact pt

## 2017-12-01 NOTE — Telephone Encounter (Signed)
Pt aware FSH 7.2 go back on depo, and I refilled depo

## 2018-01-05 ENCOUNTER — Other Ambulatory Visit (INDEPENDENT_AMBULATORY_CARE_PROVIDER_SITE_OTHER): Payer: Self-pay | Admitting: Internal Medicine

## 2018-01-05 DIAGNOSIS — K5120 Ulcerative (chronic) proctitis without complications: Secondary | ICD-10-CM

## 2018-01-24 ENCOUNTER — Telehealth: Payer: Self-pay | Admitting: Adult Health

## 2018-01-24 NOTE — Telephone Encounter (Signed)
Areola itches and has red spot on one breast, to come in Friday at 1 pm to be checked

## 2018-01-24 NOTE — Telephone Encounter (Signed)
Patient called stating that she has a red spot that has developed on her breast. Pt states that it looks almost like a mosquito bite. Please contact pt

## 2018-01-28 ENCOUNTER — Ambulatory Visit (INDEPENDENT_AMBULATORY_CARE_PROVIDER_SITE_OTHER): Payer: BC Managed Care – PPO | Admitting: Adult Health

## 2018-01-28 ENCOUNTER — Other Ambulatory Visit: Payer: Self-pay

## 2018-01-28 ENCOUNTER — Encounter: Payer: Self-pay | Admitting: Adult Health

## 2018-01-28 VITALS — BP 112/70 | HR 82 | Ht 63.0 in | Wt 152.0 lb

## 2018-01-28 DIAGNOSIS — R234 Changes in skin texture: Secondary | ICD-10-CM | POA: Diagnosis not present

## 2018-01-28 DIAGNOSIS — L299 Pruritus, unspecified: Secondary | ICD-10-CM

## 2018-01-28 MED ORDER — NYSTATIN-TRIAMCINOLONE 100000-0.1 UNIT/GM-% EX CREA: 1.0000 "application " | TOPICAL_CREAM | Freq: Two times a day (BID) | CUTANEOUS | 1 refills | Status: DC

## 2018-01-28 NOTE — Progress Notes (Signed)
Subjective:     Patient ID: Sandra Crane, female   DOB: 11-08-1966, 52 y.o.   MRN: [id number]  HPI Sandra Crane is a 52 year old white female in complaining of breasts itching and then has spot in right areola, for about 2-3 weeks.   Review of Systems Breasts itch Has spot in right areola Reviewed past medical,surgical, social and family history. Reviewed medications and allergies.     Objective:   Physical Exam BP 112/70 (BP Location: Right Arm, Patient Position: Sitting, Cuff Size: Normal)   Pulse 82   Ht 5' 3"  (1.6 m)   Wt 152 lb (68.9 kg)   BMI 26.93 kg/m     Skin warm and dry,  Breasts:no dominate palpable mass, retraction or nipple discharge, has what looks like swollen montgomery gland at 2 o'clock in areola on right.Will try mytrex cream and recheck   Assessment:     Breast skin changes, itches    Plan:    Use mytrex bid to affected areas F/U in 8 weeks  Get mammogram in July Meds ordered this encounter  Medications  . nystatin-triamcinolone (MYCOLOG II) cream    Sig: Apply 1 application topically 2 (two) times daily.    Dispense:  30 g    Refill:  1    Order Specific Question:   Supervising Provider    Answer:   Tania Ade H [[id number]]

## 2018-01-31 ENCOUNTER — Encounter (INDEPENDENT_AMBULATORY_CARE_PROVIDER_SITE_OTHER): Payer: Self-pay | Admitting: *Deleted

## 2018-01-31 ENCOUNTER — Other Ambulatory Visit (INDEPENDENT_AMBULATORY_CARE_PROVIDER_SITE_OTHER): Payer: Self-pay | Admitting: *Deleted

## 2018-01-31 DIAGNOSIS — R945 Abnormal results of liver function studies: Secondary | ICD-10-CM

## 2018-01-31 DIAGNOSIS — K5180 Other ulcerative colitis without complications: Secondary | ICD-10-CM

## 2018-01-31 DIAGNOSIS — R7989 Other specified abnormal findings of blood chemistry: Secondary | ICD-10-CM

## 2018-03-08 LAB — HEPATIC FUNCTION PANEL
AG RATIO: 1.8 (calc) (ref 1.0–2.5)
ALBUMIN MSPROF: 4.5 g/dL (ref 3.6–5.1)
ALT: 11 U/L (ref 6–29)
AST: 17 U/L (ref 10–35)
Alkaline phosphatase (APISO): 49 U/L (ref 33–130)
Bilirubin, Direct: 0.1 mg/dL (ref 0.0–0.2)
GLOBULIN: 2.5 g/dL (ref 1.9–3.7)
Indirect Bilirubin: 0.5 mg/dL (calc) (ref 0.2–1.2)
TOTAL PROTEIN: 7 g/dL (ref 6.1–8.1)
Total Bilirubin: 0.6 mg/dL (ref 0.2–1.2)

## 2018-03-08 LAB — CBC WITH DIFFERENTIAL/PLATELET
Basophils Absolute: 28 cells/uL (ref 0–200)
Basophils Relative: 0.7 %
EOS ABS: 20 {cells}/uL (ref 15–500)
Eosinophils Relative: 0.5 %
HCT: 39.4 % (ref 35.0–45.0)
HEMOGLOBIN: 13.9 g/dL (ref 11.7–15.5)
LYMPHS ABS: 568 {cells}/uL — AB (ref 850–3900)
MCH: 33.7 pg — AB (ref 27.0–33.0)
MCHC: 35.3 g/dL (ref 32.0–36.0)
MCV: 95.4 fL (ref 80.0–100.0)
MPV: 9.9 fL (ref 7.5–12.5)
Monocytes Relative: 17 %
NEUTROS ABS: 2704 {cells}/uL (ref 1500–7800)
Neutrophils Relative %: 67.6 %
PLATELETS: 241 10*3/uL (ref 140–400)
RBC: 4.13 10*6/uL (ref 3.80–5.10)
RDW: 12.3 % (ref 11.0–15.0)
Total Lymphocyte: 14.2 %
WBC: 4 10*3/uL (ref 3.8–10.8)
WBCMIX: 680 {cells}/uL (ref 200–950)

## 2018-03-08 LAB — LIPID PANEL
Cholesterol: 232 mg/dL — ABNORMAL HIGH (ref <200)
HDL: 53 mg/dL (ref >50)
LDL Cholesterol (Calc): 162 mg/dL (calc) — ABNORMAL HIGH
NON-HDL CHOLESTEROL (CALC): 179 mg/dL — AB (ref <130)
TRIGLYCERIDES: 73 mg/dL (ref <150)
Total CHOL/HDL Ratio: 4.4 (calc) (ref <5.0)

## 2018-03-17 ENCOUNTER — Telehealth: Payer: Self-pay | Admitting: Adult Health

## 2018-03-17 NOTE — Telephone Encounter (Signed)
Pt called stating that she is having a fishy odor. She has no c/o discharge or itching. She states that she has some slight cramping. Advised that she would need an appt to evaluate the source of the odor. Pt states that she has an appt on Thursday and would keep that one. Advised to call us back if s/s worsen before Thursday. Pt verbalized understanding.

## 2018-03-17 NOTE — Telephone Encounter (Signed)
Pt would like to talk to someone please about a smell she is having/ no itching no discharge/ no painful urination

## 2018-03-18 ENCOUNTER — Other Ambulatory Visit (INDEPENDENT_AMBULATORY_CARE_PROVIDER_SITE_OTHER): Payer: Self-pay | Admitting: *Deleted

## 2018-03-18 DIAGNOSIS — E7800 Pure hypercholesterolemia, unspecified: Secondary | ICD-10-CM

## 2018-03-24 ENCOUNTER — Encounter: Payer: Self-pay | Admitting: Adult Health

## 2018-03-24 ENCOUNTER — Ambulatory Visit (INDEPENDENT_AMBULATORY_CARE_PROVIDER_SITE_OTHER): Payer: BC Managed Care – PPO | Admitting: Adult Health

## 2018-03-24 VITALS — BP 110/60 | HR 78 | Ht 62.25 in | Wt 143.5 lb

## 2018-03-24 DIAGNOSIS — N898 Other specified noninflammatory disorders of vagina: Secondary | ICD-10-CM | POA: Diagnosis not present

## 2018-03-24 LAB — POCT WET PREP (WET MOUNT): CLUE CELLS WET PREP WHIFF POC: NEGATIVE

## 2018-03-24 NOTE — Progress Notes (Signed)
  Subjective:     Patient ID: Sandra Crane, female   DOB: 03-05-1966, 52 y.o.   MRN: [id number]  HPI Sandra Crane is a 52 year old white female, married in for Follow up on breast itching and it stopped after using mytrex, and now has vaginal odor at times, no tiching.   Review of Systems Breast are good, no more itching +vaginal odor  No itching Reviewed past medical,surgical, social and family history. Reviewed medications and allergies.     Objective:   Physical Exam BP 110/60 (BP Location: Left Arm, Patient Position: Sitting, Cuff Size: Normal)   Pulse 78   Ht 5' 2.25" (1.581 m)   Wt 143 lb 8 oz (65.1 kg)   BMI 26.04 kg/m     Skin warm and dry,  Breasts:no dominate palpable mass, retraction or nipple discharge. Pelvic: external genitalia is normal in appearance no lesions, vagina:scant discharge without odor,urethra has no lesions or masses noted, cervix:smooth and bulbous, uterus: normal size, shape and contour, non tender, no masses felt, adnexa: no masses or tenderness noted. Bladder is non tender and no masses felt. Wet prep:negative. Assessment:     1. Vaginal odor       Plan:     Try rephresh F/U prn

## 2018-04-21 ENCOUNTER — Other Ambulatory Visit: Payer: Self-pay | Admitting: Adult Health

## 2018-04-21 DIAGNOSIS — Z1231 Encounter for screening mammogram for malignant neoplasm of breast: Secondary | ICD-10-CM

## 2018-05-03 ENCOUNTER — Other Ambulatory Visit (INDEPENDENT_AMBULATORY_CARE_PROVIDER_SITE_OTHER): Payer: Self-pay | Admitting: Internal Medicine

## 2018-05-23 DIAGNOSIS — H35413 Lattice degeneration of retina, bilateral: Secondary | ICD-10-CM | POA: Diagnosis not present

## 2018-05-27 ENCOUNTER — Ambulatory Visit: Payer: BC Managed Care – PPO

## 2018-05-30 ENCOUNTER — Ambulatory Visit
Admission: RE | Admit: 2018-05-30 | Discharge: 2018-05-30 | Disposition: A | Discharge status: Home / Self Care | Payer: BC Managed Care – PPO | Source: Ambulatory Visit | Attending: Adult Health | Admitting: Adult Health

## 2018-05-30 DIAGNOSIS — Z1231 Encounter for screening mammogram for malignant neoplasm of breast: Secondary | ICD-10-CM

## 2018-06-21 ENCOUNTER — Other Ambulatory Visit (INDEPENDENT_AMBULATORY_CARE_PROVIDER_SITE_OTHER): Payer: Self-pay | Admitting: Internal Medicine

## 2018-06-21 DIAGNOSIS — K5120 Ulcerative (chronic) proctitis without complications: Secondary | ICD-10-CM

## 2018-06-23 ENCOUNTER — Other Ambulatory Visit (INDEPENDENT_AMBULATORY_CARE_PROVIDER_SITE_OTHER): Payer: Self-pay | Admitting: *Deleted

## 2018-06-23 DIAGNOSIS — E7800 Pure hypercholesterolemia, unspecified: Secondary | ICD-10-CM

## 2018-06-23 LAB — CBC
HCT: 36.8 % (ref 35.0–45.0)
Hemoglobin: 12.6 g/dL (ref 11.7–15.5)
MCH: 34.1 pg — ABNORMAL HIGH (ref 27.0–33.0)
MCHC: 34.2 g/dL (ref 32.0–36.0)
MCV: 99.7 fL (ref 80.0–100.0)
MPV: 10.1 fL (ref 7.5–12.5)
PLATELETS: 283 10*3/uL (ref 140–400)
RBC: 3.69 10*6/uL — AB (ref 3.80–5.10)
RDW: 12.1 % (ref 11.0–15.0)
WBC: 3.6 10*3/uL — ABNORMAL LOW (ref 3.8–10.8)

## 2018-06-23 LAB — LIPID PANEL
CHOL/HDL RATIO: 3 (calc) (ref <5.0)
CHOLESTEROL: 163 mg/dL (ref <200)
HDL: 55 mg/dL (ref >50)
LDL Cholesterol (Calc): 91 mg/dL (calc)
NON-HDL CHOLESTEROL (CALC): 108 mg/dL (ref <130)
Triglycerides: 83 mg/dL (ref <150)

## 2018-06-29 ENCOUNTER — Other Ambulatory Visit (INDEPENDENT_AMBULATORY_CARE_PROVIDER_SITE_OTHER): Payer: Self-pay | Admitting: *Deleted

## 2018-06-29 DIAGNOSIS — K5190 Ulcerative colitis, unspecified, without complications: Secondary | ICD-10-CM

## 2018-07-13 ENCOUNTER — Ambulatory Visit (INDEPENDENT_AMBULATORY_CARE_PROVIDER_SITE_OTHER): Payer: BC Managed Care – PPO | Admitting: Internal Medicine

## 2018-08-29 ENCOUNTER — Ambulatory Visit (INDEPENDENT_AMBULATORY_CARE_PROVIDER_SITE_OTHER): Payer: BC Managed Care – PPO | Admitting: Internal Medicine

## 2018-08-30 ENCOUNTER — Ambulatory Visit (INDEPENDENT_AMBULATORY_CARE_PROVIDER_SITE_OTHER): Payer: BC Managed Care – PPO | Admitting: Internal Medicine

## 2018-10-03 ENCOUNTER — Other Ambulatory Visit (HOSPITAL_COMMUNITY)
Admission: RE | Admit: 2018-10-03 | Discharge: 2018-10-03 | Disposition: A | Discharge status: Home / Self Care | Payer: BC Managed Care – PPO | Source: Ambulatory Visit | Attending: Adult Health | Admitting: Adult Health

## 2018-10-03 ENCOUNTER — Other Ambulatory Visit: Payer: Self-pay

## 2018-10-03 ENCOUNTER — Encounter (INDEPENDENT_AMBULATORY_CARE_PROVIDER_SITE_OTHER): Payer: Self-pay | Admitting: Internal Medicine

## 2018-10-03 ENCOUNTER — Ambulatory Visit (INDEPENDENT_AMBULATORY_CARE_PROVIDER_SITE_OTHER): Payer: BC Managed Care – PPO | Admitting: Adult Health

## 2018-10-03 ENCOUNTER — Encounter: Payer: Self-pay | Admitting: Adult Health

## 2018-10-03 ENCOUNTER — Ambulatory Visit (INDEPENDENT_AMBULATORY_CARE_PROVIDER_SITE_OTHER): Payer: BC Managed Care – PPO | Admitting: Internal Medicine

## 2018-10-03 VITALS — BP 124/74 | HR 76 | Ht 62.0 in | Wt 133.0 lb

## 2018-10-03 VITALS — BP 130/80 | HR 84 | Temp 98.2°F | Ht 63.0 in | Wt 133.5 lb

## 2018-10-03 DIAGNOSIS — Z01419 Encounter for gynecological examination (general) (routine) without abnormal findings: Secondary | ICD-10-CM | POA: Insufficient documentation

## 2018-10-03 DIAGNOSIS — Z1211 Encounter for screening for malignant neoplasm of colon: Secondary | ICD-10-CM | POA: Diagnosis not present

## 2018-10-03 DIAGNOSIS — K5120 Ulcerative (chronic) proctitis without complications: Secondary | ICD-10-CM

## 2018-10-03 DIAGNOSIS — Z3042 Encounter for surveillance of injectable contraceptive: Secondary | ICD-10-CM

## 2018-10-03 DIAGNOSIS — R1030 Lower abdominal pain, unspecified: Secondary | ICD-10-CM

## 2018-10-03 DIAGNOSIS — Z1212 Encounter for screening for malignant neoplasm of rectum: Secondary | ICD-10-CM | POA: Diagnosis not present

## 2018-10-03 LAB — HEMOCCULT GUIAC POC 1CARD (OFFICE): FECAL OCCULT BLD: NEGATIVE

## 2018-10-03 MED ORDER — MEDROXYPROGESTERONE ACETATE 150 MG/ML IM SUSP: INTRAMUSCULAR | 4 refills | Status: DC

## 2018-10-03 NOTE — Progress Notes (Signed)
Patient ID: Sandra Crane, female   DOB: 1966/08/29, 52 y.o.   MRN: [id number] History of Present Illness:  Sandra Crane is a 52 year old white female, married, in for well woman gyn exam and pap. PCP is Dr Gerarda Fraction.   Current Medications, Allergies, Past Medical History, Past Surgical History, Family History and Social History were reviewed in [facility] record.     Review of Systems: Patient denies any headaches, hearing loss, fatigue, blurred vision, shortness of breath, chest pain,  problems with bowel movements, urination, or intercourse. No joint pain or mood swings. Has had LLQ pain for about 3 weeks, saw Terri at Dr Olevia Perches office today, and had labs and getting CT, ?UC related.     Physical Exam:BP 124/74 (BP Location: Right Arm, Patient Position: Sitting, Cuff Size: Normal)   Pulse 76   Ht 5' 2"  (1.575 m)   Wt 133 lb (60.3 kg)   BMI 24.33 kg/m  General:  Well developed, well nourished, no acute distress Skin:  Warm and dry Neck:  Midline trachea, normal thyroid, good ROM, no lymphadenopathy Lungs; Clear to auscultation bilaterally Breast:  No dominant palpable mass, retraction, or nipple discharge Cardiovascular: Regular rate and rhythm Abdomen:  Soft, non tender, no hepatosplenomegaly Pelvic:  External genitalia is normal in appearance, no lesions.  The vagina is normal in appearance. Urethra has no lesions or masses. The cervix is smooth, pap with HPV performed.  Uterus is felt to be normal size, shape, and contour.  No adnexal masses or tenderness noted.Bladder is non tender, no masses felt. Rectal: Good sphincter tone, no polyps, or hemorrhoids felt.  Hemoccult negative. Extremities/musculoskeletal:  No swelling or varicosities noted, no clubbing or cyanosis Psych:  No mood changes, alert and cooperative,seems happy PHQ 2 score 0. Fall risk low. Examination chaperoned by Levy Pupa LPN.  Impression: 1. Encounter for gynecological examination  with Papanicolaou smear of cervix   2. Screening for colorectal cancer   3. Encounter for surveillance of injectable contraceptive       Plan: Meds ordered this encounter  Medications  . medroxyPROGESTERone (DEPO-PROVERA) 150 MG/ML injection    Sig: For IM injection every 3 months    Dispense:  1 mL    Refill:  4    Order Specific Question:   Supervising Provider    Answer:   Tania Ade H [[id number]]  Skip next dose of depo and check FSH, 4 weeks after that Physical in 1 year Pap in 3 if normal Mammogram yearly

## 2018-10-03 NOTE — Patient Instructions (Signed)
Labs and CT.

## 2018-10-03 NOTE — Progress Notes (Signed)
   Subjective:    Patient ID: Sandra Crane, female    DOB: 20-Sep-1966, 52 y.o.   MRN: [id number]  HPI Presents today with c/o left sided abdominal pain.  She was last seen in October of of 2016.  She says for 3 weeks she has a cramp in her LLQ. She also has aching in her lower abdomen. She may not have a BM for 2 days, then she may have a loose stool.  She describes as a menstrual cramp. She injured her leg back in September and thought she may have a pulled muscle. Appetite is okay. No weight loss.  No melena or BRRB  Her last colonoscopy was in 2016 by Dr. Laural Golden which revealed Prep excellent. 6 mm polyp hot snared from ascending colon. Mucosa of rest of the colon was normal. Some areas with scarring but no other abnormalities noted. Normal rectal mucosa. Small hemorrhoids above the dentate line.   04/29/2005 Ileoscopy, segmental biopsy, stool collection: Dr. Gala Romney: Inflammatory changes of the rectum and entire colon (more pronounced in the rectum and sigmoid colon) consistent with ----colitis,normal terminal ileum. Segmental biopsies and stool samples obtained    07/13/2009 Flexible sigmoid:  Diarrhea, mucous, bleeding.  Active colitis involving the sigmoid colon with rectal sparing. Multiple biopsies taken.   Review of Systems Past Medical History:  Diagnosis Date  . Contraceptive management 09/12/2013  . Elevated cholesterol   . Ulcerative colitis     Past Surgical History:  Procedure Laterality Date  . CESAREAN SECTION  [phone]  . COLONOSCOPY    . COLONOSCOPY N/A 10/24/2015   Procedure: COLONOSCOPY;  Surgeon: Rogene Houston, MD;  Location: [facility];  Service: Endoscopy;  Laterality: N/A;  12:00  . root canal with crown    . SIGMOIDOSCOPY  07/19/2009  . TONSILLECTOMY AND ADENOIDECTOMY N/A 11/10/2016   Procedure: TONSILLECTOMY AND ADENOIDECTOMY;  Surgeon: Leta Baptist, MD;  Location: [facility];  Service: ENT;  Laterality: N/A;  . WISDOM TOOTH  EXTRACTION      No Known Allergies  Current Outpatient Medications on File Prior to Visit  Medication Sig Dispense Refill  . medroxyPROGESTERone (DEPO-PROVERA) 150 MG/ML injection For IM injection every 3 months 1 mL 4  . mercaptopurine (PURINETHOL) 50 MG tablet TAKE TABLET BY MOUTH DAILY ON EMPTY STOMACH ONE HOUR BEFORE MEALS OR TWO HOURS AFTER MEALS 30 tablet 6  . mesalamine (LIALDA) 1.2 g EC tablet TAKE 2 TABLETS BY MOUTH TWICE DAILY 120 tablet 5  . Multiple Vitamin (MULTIVITAMIN WITH MINERALS) TABS tablet Take 1 tablet by mouth daily.    . simvastatin (ZOCOR) 40 MG tablet Take 1 tablet (40 mg total) by mouth every evening. 30 tablet 11   No current facility-administered medications on file prior to visit.         Objective:   Physical Exam Blood pressure 130/80, pulse 84, temperature 98.2 F (36.8 C), height 5' 3"  (1.6 m), weight 133 lb 8 oz (60.6 kg). Alert and oriented. Skin warm and dry. Oral mucosa is moist.   . Sclera anicteric, conjunctivae is pink. Thyroid not enlarged. No cervical lymphadenopathy. Lungs clear. Heart regular rate and rhythm.  Abdomen is soft. Bowel sounds are positive. No hepatomegaly. No abdominal masses felt.  Tenderness LLQ. Marland Kitchen  No edema to lower extremities.          Assessment & Plan:  LLQ pain. Hx of UC. Will get a CT abdomen/pelvis with CM. CBC and CRP. Further recommendations to follow.

## 2018-10-04 LAB — CBC WITH DIFFERENTIAL/PLATELET
BASOS PCT: 0.6 %
Basophils Absolute: 29 cells/uL (ref 0–200)
EOS ABS: 29 {cells}/uL (ref 15–500)
Eosinophils Relative: 0.6 %
HCT: 39.6 % (ref 35.0–45.0)
HEMOGLOBIN: 13.5 g/dL (ref 11.7–15.5)
Lymphs Abs: 926 cells/uL (ref 850–3900)
MCH: 32.3 pg (ref 27.0–33.0)
MCHC: 34.1 g/dL (ref 32.0–36.0)
MCV: 94.7 fL (ref 80.0–100.0)
MPV: 9.8 fL (ref 7.5–12.5)
Monocytes Relative: 17.6 %
NEUTROS ABS: 2971 {cells}/uL (ref 1500–7800)
Neutrophils Relative %: 61.9 %
Platelets: 265 10*3/uL (ref 140–400)
RBC: 4.18 10*6/uL (ref 3.80–5.10)
RDW: 12.8 % (ref 11.0–15.0)
Total Lymphocyte: 19.3 %
WBC: 4.8 10*3/uL (ref 3.8–10.8)
WBCMIX: 845 {cells}/uL (ref 200–950)

## 2018-10-04 LAB — C-REACTIVE PROTEIN: CRP: 0.4 mg/L (ref <8.0)

## 2018-10-10 LAB — CYTOLOGY - PAP
DIAGNOSIS: NEGATIVE
HPV (WINDOPATH): NOT DETECTED

## 2018-10-14 ENCOUNTER — Other Ambulatory Visit (INDEPENDENT_AMBULATORY_CARE_PROVIDER_SITE_OTHER): Payer: Self-pay | Admitting: Internal Medicine

## 2018-10-24 ENCOUNTER — Ambulatory Visit (HOSPITAL_COMMUNITY)
Admission: RE | Admit: 2018-10-24 | Discharge: 2018-10-24 | Disposition: A | Discharge status: Home / Self Care | Payer: BC Managed Care – PPO | Source: Ambulatory Visit | Attending: Internal Medicine | Admitting: Internal Medicine

## 2018-10-24 DIAGNOSIS — K5120 Ulcerative (chronic) proctitis without complications: Secondary | ICD-10-CM

## 2018-10-24 DIAGNOSIS — R1030 Lower abdominal pain, unspecified: Secondary | ICD-10-CM | POA: Insufficient documentation

## 2018-10-24 DIAGNOSIS — N200 Calculus of kidney: Secondary | ICD-10-CM | POA: Diagnosis not present

## 2018-10-24 MED ORDER — IOPAMIDOL (ISOVUE-300) INJECTION 61%
100.0000 mL | Freq: Once | INTRAVENOUS | Status: AC | PRN
  Administered 2018-10-24: 100 mL via INTRAVENOUS

## 2018-10-25 ENCOUNTER — Encounter (INDEPENDENT_AMBULATORY_CARE_PROVIDER_SITE_OTHER): Payer: Self-pay

## 2018-11-13 ENCOUNTER — Other Ambulatory Visit (INDEPENDENT_AMBULATORY_CARE_PROVIDER_SITE_OTHER): Payer: Self-pay | Admitting: Internal Medicine

## 2018-12-05 ENCOUNTER — Telehealth (INDEPENDENT_AMBULATORY_CARE_PROVIDER_SITE_OTHER): Payer: Self-pay | Admitting: Internal Medicine

## 2018-12-05 NOTE — Telephone Encounter (Signed)
err

## 2018-12-08 ENCOUNTER — Other Ambulatory Visit (INDEPENDENT_AMBULATORY_CARE_PROVIDER_SITE_OTHER): Payer: Self-pay | Admitting: Internal Medicine

## 2018-12-16 ENCOUNTER — Other Ambulatory Visit: Payer: Self-pay | Admitting: Adult Health

## 2018-12-16 DIAGNOSIS — N951 Menopausal and female climacteric states: Secondary | ICD-10-CM

## 2018-12-16 MED ORDER — METRONIDAZOLE 500 MG PO TABS: 500.0000 mg | ORAL_TABLET | Freq: Two times a day (BID) | ORAL | 0 refills | Status: DC

## 2018-12-16 NOTE — Progress Notes (Signed)
Ck FSH

## 2019-01-02 ENCOUNTER — Telehealth: Payer: Self-pay | Admitting: Adult Health

## 2019-01-02 ENCOUNTER — Other Ambulatory Visit: Payer: Self-pay | Admitting: Adult Health

## 2019-01-02 DIAGNOSIS — E7800 Pure hypercholesterolemia, unspecified: Secondary | ICD-10-CM

## 2019-01-02 DIAGNOSIS — N951 Menopausal and female climacteric states: Secondary | ICD-10-CM

## 2019-01-02 NOTE — Telephone Encounter (Signed)
Patient called stating that she would like Anderson Malta to place order for her to go to the labs on Saturday to check her hormones see if she is going through menopause and she would like to check her Cholesterol as well. Please contact pt

## 2019-01-02 NOTE — Progress Notes (Signed)
Check FSH,CMP and lipids

## 2019-01-02 NOTE — Telephone Encounter (Signed)
Left message that orders placed, labs open 8-12 saturdays

## 2019-01-08 LAB — LIPID PANEL
CHOLESTEROL TOTAL: 211 mg/dL — AB (ref 100–199)
Chol/HDL Ratio: 4.1 ratio (ref 0.0–4.4)
HDL: 51 mg/dL (ref >39)
LDL Calculated: 137 mg/dL — ABNORMAL HIGH (ref 0–99)
TRIGLYCERIDES: 117 mg/dL (ref 0–149)
VLDL Cholesterol Cal: 23 mg/dL (ref 5–40)

## 2019-01-08 LAB — COMPREHENSIVE METABOLIC PANEL
A/G RATIO: 2 (ref 1.2–2.2)
ALBUMIN: 4.5 g/dL (ref 3.8–4.9)
ALT: 32 IU/L (ref 0–32)
AST: 24 IU/L (ref 0–40)
Alkaline Phosphatase: 53 IU/L (ref 39–117)
BILIRUBIN TOTAL: 0.6 mg/dL (ref 0.0–1.2)
BUN / CREAT RATIO: 21 (ref 9–23)
BUN: 15 mg/dL (ref 6–24)
CALCIUM: 9.4 mg/dL (ref 8.7–10.2)
CHLORIDE: 107 mmol/L — AB (ref 96–106)
CO2: 21 mmol/L (ref 20–29)
Creatinine, Ser: 0.73 mg/dL (ref 0.57–1.00)
GFR calc Af Amer: 109 mL/min/{1.73_m2} (ref >59)
GFR calc non Af Amer: 94 mL/min/{1.73_m2} (ref >59)
GLOBULIN, TOTAL: 2.2 g/dL (ref 1.5–4.5)
Glucose: 91 mg/dL (ref 65–99)
POTASSIUM: 4.1 mmol/L (ref 3.5–5.2)
Sodium: 143 mmol/L (ref 134–144)
TOTAL PROTEIN: 6.7 g/dL (ref 6.0–8.5)

## 2019-01-08 LAB — FOLLICLE STIMULATING HORMONE: FSH: 63.9 m[IU]/mL

## 2019-01-26 ENCOUNTER — Encounter (INDEPENDENT_AMBULATORY_CARE_PROVIDER_SITE_OTHER): Payer: Self-pay

## 2019-01-27 ENCOUNTER — Telehealth (INDEPENDENT_AMBULATORY_CARE_PROVIDER_SITE_OTHER): Payer: Self-pay | Admitting: *Deleted

## 2019-01-27 NOTE — Telephone Encounter (Signed)
Sandra Crane or Dr. 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 Is my colitis an underlying health issue? If so than I can review my options with my principal   Thank you  Sandra Crane  [phone]

## 2019-02-02 NOTE — Telephone Encounter (Signed)
Call returned. She is taking all the precautions that all of [place] are taking. We will not make any changes to her medications. She is due for CBC with differential.  Will wait 1 month until emergency subsides.

## 2019-02-03 ENCOUNTER — Encounter (INDEPENDENT_AMBULATORY_CARE_PROVIDER_SITE_OTHER): Payer: Self-pay | Admitting: *Deleted

## 2019-02-03 ENCOUNTER — Other Ambulatory Visit (INDEPENDENT_AMBULATORY_CARE_PROVIDER_SITE_OTHER): Payer: Self-pay | Admitting: *Deleted

## 2019-02-03 DIAGNOSIS — K51918 Ulcerative colitis, unspecified with other complication: Secondary | ICD-10-CM

## 2019-02-03 DIAGNOSIS — K5190 Ulcerative colitis, unspecified, without complications: Secondary | ICD-10-CM

## 2019-02-03 NOTE — Telephone Encounter (Signed)
CBC/Diff is noted for 1 month. A letter will be sent as a reminder to the patient.

## 2019-02-03 NOTE — Progress Notes (Signed)
Lab has been ordered.

## 2019-03-01 LAB — CBC WITH DIFFERENTIAL/PLATELET
ABSOLUTE MONOCYTES: 577 {cells}/uL (ref 200–950)
BASOS ABS: 11 {cells}/uL (ref 0–200)
Basophils Relative: 0.3 %
EOS ABS: 30 {cells}/uL (ref 15–500)
Eosinophils Relative: 0.8 %
HCT: 40.2 % (ref 35.0–45.0)
Hemoglobin: 13.7 g/dL (ref 11.7–15.5)
Lymphs Abs: 670 cells/uL — ABNORMAL LOW (ref 850–3900)
MCH: 33.7 pg — AB (ref 27.0–33.0)
MCHC: 34.1 g/dL (ref 32.0–36.0)
MCV: 98.8 fL (ref 80.0–100.0)
MPV: 9.9 fL (ref 7.5–12.5)
Monocytes Relative: 15.6 %
NEUTROS PCT: 65.2 %
Neutro Abs: 2412 cells/uL (ref 1500–7800)
PLATELETS: 239 10*3/uL (ref 140–400)
RBC: 4.07 10*6/uL (ref 3.80–5.10)
RDW: 12.2 % (ref 11.0–15.0)
TOTAL LYMPHOCYTE: 18.1 %
WBC: 3.7 10*3/uL — ABNORMAL LOW (ref 3.8–10.8)

## 2019-03-06 ENCOUNTER — Other Ambulatory Visit (INDEPENDENT_AMBULATORY_CARE_PROVIDER_SITE_OTHER): Payer: Self-pay | Admitting: Internal Medicine

## 2019-03-06 ENCOUNTER — Other Ambulatory Visit (INDEPENDENT_AMBULATORY_CARE_PROVIDER_SITE_OTHER): Payer: Self-pay | Admitting: *Deleted

## 2019-03-06 ENCOUNTER — Encounter: Payer: Self-pay | Admitting: *Deleted

## 2019-03-06 DIAGNOSIS — K5190 Ulcerative colitis, unspecified, without complications: Secondary | ICD-10-CM

## 2019-03-06 DIAGNOSIS — K5120 Ulcerative (chronic) proctitis without complications: Secondary | ICD-10-CM

## 2019-03-07 ENCOUNTER — Other Ambulatory Visit: Payer: Self-pay

## 2019-03-07 ENCOUNTER — Encounter: Payer: Self-pay | Admitting: Adult Health

## 2019-03-07 ENCOUNTER — Ambulatory Visit (INDEPENDENT_AMBULATORY_CARE_PROVIDER_SITE_OTHER): Payer: BC Managed Care – PPO | Admitting: Adult Health

## 2019-03-07 VITALS — BP 109/70 | HR 65 | Temp 98.8°F | Ht 63.0 in | Wt 131.0 lb

## 2019-03-07 DIAGNOSIS — N898 Other specified noninflammatory disorders of vagina: Secondary | ICD-10-CM | POA: Diagnosis not present

## 2019-03-07 DIAGNOSIS — B379 Candidiasis, unspecified: Secondary | ICD-10-CM

## 2019-03-07 LAB — POCT WET PREP (WET MOUNT)

## 2019-03-07 MED ORDER — NYSTATIN-TRIAMCINOLONE 100000-0.1 UNIT/GM-% EX CREA: 1.0000 "application " | TOPICAL_CREAM | Freq: Two times a day (BID) | CUTANEOUS | 0 refills | Status: DC

## 2019-03-07 MED ORDER — FLUCONAZOLE 150 MG PO TABS: ORAL_TABLET | ORAL | 1 refills | Status: DC

## 2019-03-07 NOTE — Progress Notes (Signed)
  Subjective:     Patient ID: Sandra Crane, female   DOB: 1966-06-26, 53 y.o.   MRN: [id number]  HPI   Review of Systems +vaginal itching used 1 time monistat     Reviewed past medical,surgical, social and family history. Reviewed medications and allergies.  Objective:   Physical Exam BP 109/70 (BP Location: Left Arm, Patient Position: Sitting, Cuff Size: Normal)   Pulse 65   Temp 98.8 F (37.1 C)   Ht 5' 3"  (1.6 m)   Wt 131 lb (59.4 kg)   BMI 23.21 kg/m  Skin warm and dry.Pelvic: external genitalia is normal in appearance no lesions, vagina: scant white discharge without odor,urethra has no lesions or masses noted, cervix:smooth, uterus: normal size, shape and contour, non tender, no masses felt, adnexa: no masses or tenderness noted. Bladder is non tender and no masses felt. Wet prep: + for yeast Fall risk is low. Examination chaperoned by Levy Pupa LPN.     Assessment:     1. Vaginal itching - POCT Wet Prep [facility]) Meds ordered this encounter  Medications  . fluconazole (DIFLUCAN) 150 MG tablet    Sig: Take 1 now and repeat 1 in 3 days    Dispense:  2 tablet    Refill:  1    Order Specific Question:   Supervising Provider    Answer:   EURE, LUTHER H [[id number]]  . nystatin-triamcinolone (MYCOLOG II) cream    Sig: Apply 1 application topically 2 (two) times daily.    Dispense:  30 g    Refill:  0    Order Specific Question:   Supervising Provider    Answer:   Elonda Husky, LUTHER H [[id number]]    2. Yeast infection - POCT Wet Prep [facility]) Will rx diflucan and mytrex    Plan:     Meds ordered this encounter  Medications  . fluconazole (DIFLUCAN) 150 MG tablet    Sig: Take 1 now and repeat 1 in 3 days    Dispense:  2 tablet    Refill:  1    Order Specific Question:   Supervising Provider    Answer:   EURE, LUTHER H [[id number]]  . nystatin-triamcinolone (MYCOLOG II) cream    Sig: Apply 1 application topically 2 (two) times daily.    Dispense:  30 g    Refill:  0    Order Specific Question:   Supervising Provider    Answer:   Tania Ade H [[id number]]  F/U prn

## 2019-04-18 ENCOUNTER — Other Ambulatory Visit: Payer: Self-pay | Admitting: Adult Health

## 2019-04-18 DIAGNOSIS — Z1231 Encounter for screening mammogram for malignant neoplasm of breast: Secondary | ICD-10-CM

## 2019-05-01 ENCOUNTER — Other Ambulatory Visit (INDEPENDENT_AMBULATORY_CARE_PROVIDER_SITE_OTHER): Payer: Self-pay | Admitting: *Deleted

## 2019-05-01 DIAGNOSIS — K5190 Ulcerative colitis, unspecified, without complications: Secondary | ICD-10-CM

## 2019-06-05 LAB — CBC WITH DIFFERENTIAL/PLATELET
Absolute Monocytes: 628 cells/uL (ref 200–950)
Basophils Absolute: 28 cells/uL (ref 0–200)
Basophils Relative: 0.7 %
Eosinophils Absolute: 20 cells/uL (ref 15–500)
Eosinophils Relative: 0.5 %
HCT: 39.1 % (ref 35.0–45.0)
Hemoglobin: 13 g/dL (ref 11.7–15.5)
Lymphs Abs: 1024 cells/uL (ref 850–3900)
MCH: 33.4 pg — ABNORMAL HIGH (ref 27.0–33.0)
MCHC: 33.2 g/dL (ref 32.0–36.0)
MCV: 100.5 fL — ABNORMAL HIGH (ref 80.0–100.0)
MPV: 9.8 fL (ref 7.5–12.5)
Monocytes Relative: 15.7 %
Neutro Abs: 2300 cells/uL (ref 1500–7800)
Neutrophils Relative %: 57.5 %
Platelets: 206 10*3/uL (ref 140–400)
RBC: 3.89 10*6/uL (ref 3.80–5.10)
RDW: 12.1 % (ref 11.0–15.0)
Total Lymphocyte: 25.6 %
WBC: 4 10*3/uL (ref 3.8–10.8)

## 2019-06-06 ENCOUNTER — Other Ambulatory Visit: Payer: Self-pay

## 2019-06-06 ENCOUNTER — Other Ambulatory Visit (INDEPENDENT_AMBULATORY_CARE_PROVIDER_SITE_OTHER): Payer: Self-pay | Admitting: *Deleted

## 2019-06-06 ENCOUNTER — Ambulatory Visit
Admission: RE | Admit: 2019-06-06 | Discharge: 2019-06-06 | Disposition: A | Discharge status: Home / Self Care | Payer: BC Managed Care – PPO | Source: Ambulatory Visit | Attending: Adult Health | Admitting: Adult Health

## 2019-06-06 DIAGNOSIS — K5120 Ulcerative (chronic) proctitis without complications: Secondary | ICD-10-CM

## 2019-06-06 DIAGNOSIS — Z1231 Encounter for screening mammogram for malignant neoplasm of breast: Secondary | ICD-10-CM

## 2019-06-18 ENCOUNTER — Other Ambulatory Visit (INDEPENDENT_AMBULATORY_CARE_PROVIDER_SITE_OTHER): Payer: Self-pay | Admitting: Internal Medicine

## 2019-07-09 ENCOUNTER — Other Ambulatory Visit (INDEPENDENT_AMBULATORY_CARE_PROVIDER_SITE_OTHER): Payer: Self-pay | Admitting: Internal Medicine

## 2019-08-04 ENCOUNTER — Other Ambulatory Visit (INDEPENDENT_AMBULATORY_CARE_PROVIDER_SITE_OTHER): Payer: Self-pay | Admitting: *Deleted

## 2019-08-04 DIAGNOSIS — K5120 Ulcerative (chronic) proctitis without complications: Secondary | ICD-10-CM

## 2019-09-06 LAB — HEPATIC FUNCTION PANEL
AG Ratio: 2 (calc) (ref 1.0–2.5)
ALT: 23 U/L (ref 6–29)
AST: 19 U/L (ref 10–35)
Albumin: 4.5 g/dL (ref 3.6–5.1)
Alkaline phosphatase (APISO): 47 U/L (ref 37–153)
Bilirubin, Direct: 0.2 mg/dL (ref 0.0–0.2)
Globulin: 2.2 g/dL (calc) (ref 1.9–3.7)
Indirect Bilirubin: 0.7 mg/dL (calc) (ref 0.2–1.2)
Total Bilirubin: 0.9 mg/dL (ref 0.2–1.2)
Total Protein: 6.7 g/dL (ref 6.1–8.1)

## 2019-09-09 ENCOUNTER — Other Ambulatory Visit (INDEPENDENT_AMBULATORY_CARE_PROVIDER_SITE_OTHER): Payer: Self-pay | Admitting: Internal Medicine

## 2019-09-09 DIAGNOSIS — E7800 Pure hypercholesterolemia, unspecified: Secondary | ICD-10-CM

## 2019-09-09 DIAGNOSIS — K5190 Ulcerative colitis, unspecified, without complications: Secondary | ICD-10-CM

## 2019-09-12 LAB — CBC WITH DIFFERENTIAL/PLATELET
Absolute Monocytes: 448 cells/uL (ref 200–950)
Basophils Absolute: 30 cells/uL (ref 0–200)
Basophils Relative: 1.2 %
Eosinophils Absolute: 50 cells/uL (ref 15–500)
Eosinophils Relative: 2 %
HCT: 39.7 % (ref 35.0–45.0)
Hemoglobin: 13.5 g/dL (ref 11.7–15.5)
Lymphs Abs: 498 cells/uL — ABNORMAL LOW (ref 850–3900)
MCH: 33.7 pg — ABNORMAL HIGH (ref 27.0–33.0)
MCHC: 34 g/dL (ref 32.0–36.0)
MCV: 99 fL (ref 80.0–100.0)
MPV: 9.9 fL (ref 7.5–12.5)
Monocytes Relative: 17.9 %
Neutro Abs: 1475 cells/uL — ABNORMAL LOW (ref 1500–7800)
Neutrophils Relative %: 59 %
Platelets: 247 10*3/uL (ref 140–400)
RBC: 4.01 10*6/uL (ref 3.80–5.10)
RDW: 11.7 % (ref 11.0–15.0)
Total Lymphocyte: 19.9 %
WBC: 2.5 10*3/uL — ABNORMAL LOW (ref 3.8–10.8)

## 2019-09-12 LAB — LIPID PANEL
Cholesterol: 214 mg/dL — ABNORMAL HIGH (ref <200)
HDL: 69 mg/dL (ref >=50)
LDL Cholesterol (Calc): 128 mg/dL (calc) — ABNORMAL HIGH
Non-HDL Cholesterol (Calc): 145 mg/dL (calc) — ABNORMAL HIGH (ref <130)
Total CHOL/HDL Ratio: 3.1 (calc) (ref <5.0)
Triglycerides: 75 mg/dL (ref <150)

## 2019-09-13 ENCOUNTER — Other Ambulatory Visit (INDEPENDENT_AMBULATORY_CARE_PROVIDER_SITE_OTHER): Payer: Self-pay | Admitting: *Deleted

## 2019-09-13 DIAGNOSIS — E7800 Pure hypercholesterolemia, unspecified: Secondary | ICD-10-CM

## 2019-09-13 DIAGNOSIS — R945 Abnormal results of liver function studies: Secondary | ICD-10-CM

## 2019-09-13 DIAGNOSIS — K5190 Ulcerative colitis, unspecified, without complications: Secondary | ICD-10-CM

## 2019-09-13 DIAGNOSIS — R7989 Other specified abnormal findings of blood chemistry: Secondary | ICD-10-CM

## 2019-09-30 LAB — CBC WITH DIFFERENTIAL/PLATELET
Absolute Monocytes: 614 cells/uL (ref 200–950)
Basophils Absolute: 11 cells/uL (ref 0–200)
Basophils Relative: 0.3 %
Eosinophils Absolute: 48 cells/uL (ref 15–500)
Eosinophils Relative: 1.3 %
HCT: 39.6 % (ref 35.0–45.0)
Hemoglobin: 13.1 g/dL (ref 11.7–15.5)
Lymphs Abs: 644 cells/uL — ABNORMAL LOW (ref 850–3900)
MCH: 33.1 pg — ABNORMAL HIGH (ref 27.0–33.0)
MCHC: 33.1 g/dL (ref 32.0–36.0)
MCV: 100 fL (ref 80.0–100.0)
MPV: 9.7 fL (ref 7.5–12.5)
Monocytes Relative: 16.6 %
Neutro Abs: 2383 cells/uL (ref 1500–7800)
Neutrophils Relative %: 64.4 %
Platelets: 233 10*3/uL (ref 140–400)
RBC: 3.96 10*6/uL (ref 3.80–5.10)
RDW: 11.5 % (ref 11.0–15.0)
Total Lymphocyte: 17.4 %
WBC: 3.7 10*3/uL — ABNORMAL LOW (ref 3.8–10.8)

## 2019-09-30 LAB — THIOPURINE METABOLITES
6 MMP(6-Methylmercaptopurine): 3746 pmol/8x10(8)RBC (ref <5700)
6 TG(6-Thioguanine): 224 pmol/8x10(8)RBC — ABNORMAL LOW (ref 235–400)

## 2019-10-03 ENCOUNTER — Other Ambulatory Visit (INDEPENDENT_AMBULATORY_CARE_PROVIDER_SITE_OTHER): Payer: Self-pay | Admitting: *Deleted

## 2019-10-03 DIAGNOSIS — R7989 Other specified abnormal findings of blood chemistry: Secondary | ICD-10-CM

## 2019-10-03 DIAGNOSIS — K5190 Ulcerative colitis, unspecified, without complications: Secondary | ICD-10-CM

## 2019-10-03 DIAGNOSIS — R945 Abnormal results of liver function studies: Secondary | ICD-10-CM

## 2019-10-03 NOTE — Progress Notes (Signed)
cbc

## 2019-10-04 ENCOUNTER — Encounter (INDEPENDENT_AMBULATORY_CARE_PROVIDER_SITE_OTHER): Payer: Self-pay | Admitting: Nurse Practitioner

## 2019-10-04 ENCOUNTER — Ambulatory Visit (INDEPENDENT_AMBULATORY_CARE_PROVIDER_SITE_OTHER): Payer: BC Managed Care – PPO | Admitting: Nurse Practitioner

## 2019-10-04 ENCOUNTER — Other Ambulatory Visit: Payer: Self-pay

## 2019-10-04 VITALS — BP 121/79 | HR 68 | Temp 98.4°F | Ht 63.0 in | Wt 143.4 lb

## 2019-10-04 DIAGNOSIS — K5180 Other ulcerative colitis without complications: Secondary | ICD-10-CM | POA: Diagnosis not present

## 2019-10-04 NOTE — Patient Instructions (Signed)
1.  Continue 6-MP 50 mg 1 tab p.o. daily and Lialda 1.2 g 2 tabs by mouth twice daily as previously prescribed by Dr. Melony Overly  2.  Repeat CBC, hepatic panel and 6-MP metabolite levels in 3 months  3.  Patient to call our office if she develops any illness or infectious process  4.  Next colonoscopy due December 2021

## 2019-10-04 NOTE — Progress Notes (Signed)
Subjective:    Patient ID: Sandra Crane, female    DOB: [birth year], 53 y.o.   MRN: [id number]  HPI She was initially diagnosed with ulcerative colitis initially diagnosed in 2006.  Previously on oral Mesalamine, Remicade in 2010  then switched to 6MP in 2011. She remains on Mesalamine 1.2 gm two tabs po bid and 6MP 13m once daily. She presents today to discuss her recent lab results. LFTs remain stable 10/28 AST 19. ALT 23. Alk phos 47. T. Bili 0.9. Decreased WBC 2.5 on 11/3. Repeat labs done 11/16 WBC  3.7. Hg 13.1. HCT 39.6. 6MMP 3,746 ( <5,700) and 6TG 224 (235-400). She was advised by Dr. RLaural Goldento continue 6MP at 576mdaily and to repeat labs in 3 months. She denies having any abdominal pain. She is passing a solid brown BM every other day. No rectal bleeding or melena. No mucous per the rectum. She has gained 7lbs since Covid 19. No complaints today. Her most recent colonoscopy was 10/24/2015, see results below.   Colonoscopy 10/24/2015: Examination performed to cecum. Ulcerative colitis is in remission( complete mucosal healing). A 6 mm polyp hot snared from ascending colon. Small internal hemorrhoids.   CBC Latest Ref Rng & Units 09/25/2019 09/12/2019 06/05/2019  WBC 3.8 - 10.8 Thousand/uL 3.7(L) 2.5(L) 4.0  Hemoglobin 11.7 - 15.5 g/dL 13.1 13.5 13.0  Hematocrit 35.0 - 45.0 % 39.6 39.7 39.1  Platelets 140 - 400 Thousand/uL 233 247 206     Past Medical History:  Diagnosis Date  . Contraceptive management 09/12/2013  . Elevated cholesterol   . Ulcerative colitis    Current Outpatient Medications on File Prior to Visit  Medication Sig Dispense Refill  . cetirizine (ZYRTEC) 10 MG tablet Take 10 mg by mouth daily as needed for allergies.    . Ferrous Sulfate (IRON PO) Take by mouth daily.    . mercaptopurine (PURINETHOL) 50 MG tablet TAKE TABLET BY MOUTH DAILY ON EMPTY STOMACH ONE HOUR BEFORE MEALS OR TWO HOURS AFTER MEALS 30 tablet 6  . mesalamine (LIALDA) 1.2 g EC  tablet TAKE 2 TABLETS BY MOUTH TWICE DAILY 120 tablet 5  . Multiple Vitamin (MULTIVITAMIN WITH MINERALS) TABS tablet Take 1 tablet by mouth daily.    . simvastatin (ZOCOR) 40 MG tablet TAKE 1 TABLET(40 MG) BY MOUTH EVERY EVENING 30 tablet 5  . nystatin-triamcinolone (MYCOLOG II) cream Apply 1 application topically 2 (two) times daily. 30 g 0   No current facility-administered medications on file prior to visit.    No Known Allergies  Review of Systems See HPI, all other systems reviewed and are negative     Objective:   Physical Exam  BP 121/79 (BP Location: Right Arm, Patient Position: Sitting, Cuff Size: Normal)   Pulse 68   Temp 98.4 F (36.9 C) (Oral)   Ht _0  (1.6 m)   Wt 143 lb 6.4 oz (65 kg)   BMI 25.40 kg/m   General: [age]ear old female well developed in NAD Eyes: sclera nonicteric, conjunctiva pink Mouth: no ulcers or lesions Neck: supple Heart: RRR, no murmurs Lungs: clear throughout Abdomen: soft, nontender, + BS x 4 quads, no HSM Extremities: no edema Neuro: alert and oriented x 4, no focal deficits    Assessment & Plan:   [age]ear old female with ulcerative colitis maintained on 6MP with recent drop in WBC which has improved. 6 TG level slightly subtherapeutic level with 6MMP within normal range without signs of hepatotoxicity. -Continue  6MP 35m po daily for now. If repeat labs show significant leukocytosis 6MP may need to be discontinued - Continue Lialda 1.2 gm two tabs po bid -Repeat labs in 3 months as already ordered by Dr. RLaural Golden( I would recommend rechecking 6TG and 6MMP levels in 3 months as well) -Patient will call our office if she develops any illness or infectious process -Colonoscopy due 10/2020 -Follow up in office in 3 months

## 2019-11-14 ENCOUNTER — Ambulatory Visit (INDEPENDENT_AMBULATORY_CARE_PROVIDER_SITE_OTHER): Payer: BC Managed Care – PPO | Admitting: Adult Health

## 2019-11-14 ENCOUNTER — Other Ambulatory Visit: Payer: Self-pay

## 2019-11-14 ENCOUNTER — Encounter: Payer: Self-pay | Admitting: Adult Health

## 2019-11-14 VITALS — BP 111/78 | HR 65 | Ht 63.0 in | Wt 144.0 lb

## 2019-11-14 DIAGNOSIS — Z1211 Encounter for screening for malignant neoplasm of colon: Secondary | ICD-10-CM | POA: Diagnosis not present

## 2019-11-14 DIAGNOSIS — Z01419 Encounter for gynecological examination (general) (routine) without abnormal findings: Secondary | ICD-10-CM | POA: Insufficient documentation

## 2019-11-14 DIAGNOSIS — N950 Postmenopausal bleeding: Secondary | ICD-10-CM

## 2019-11-14 LAB — HEMOCCULT GUIAC POC 1CARD (OFFICE): Fecal Occult Blood, POC: NEGATIVE

## 2019-11-14 NOTE — Progress Notes (Signed)
Patient ID: Sandra Crane, female   DOB: 10/09/1966, 54 y.o.   MRN: [id number] History of Present Illness: Sandra Crane is a 54 year old white female, married, G3P3 in for a well woman gyn exam, she had a normal pap with negative HPV 10/03/18. PCP is Dr Gerarda Fraction.   Current Medications, Allergies, Past Medical History, Past Surgical History, Family History and Social History were reviewed in [place] record.     Review of Systems: Patient denies any headaches, hearing loss, fatigue, blurred vision, shortness of breath, chest pain, abdominal pain, problems with bowel movements, urination, or intercourse. No joint pain or mood swings. +breast tenderness for about 2 weeks and spotting brown, had FSH of 63.9 01/07/19, had last depo in late 2019.     Physical Exam:BP 111/78 (BP Location: Left Arm, Patient Position: Sitting, Cuff Size: Normal)   Pulse 65   Ht 5' 3"  (1.6 m)   Wt 144 lb (65.3 kg)   BMI 25.51 kg/m  General:  Well developed, well nourished, no acute distress Skin:  Warm and dry Neck:  Midline trachea, normal thyroid, good ROM, no lymphadenopathy Lungs; Clear to auscultation bilaterally Breast:  No dominant palpable mass, retraction, or nipple discharge Cardiovascular: Regular rate and rhythm Abdomen:  Soft, non tender, no hepatosplenomegaly Pelvic:  External genitalia is normal in appearance, no lesions.  The vagina is normal in appearance. Urethra has no lesions or masses. The cervix is bulbous. +brown blood at os. Uterus is felt to be normal size, shape, and contour.  No adnexal masses or tenderness noted.Bladder is non tender, no masses felt. Rectal: Good sphincter tone, no polyps, or hemorrhoids felt.  Hemoccult negative. Extremities/musculoskeletal:  No swelling or varicosities noted, no clubbing or cyanosis Psych:  No mood changes, alert and cooperative,seems happy Fall risk is low PHQ 2 score is 0. Examination chaperoned by Levy Pupa  LPN.  Impression and Plan: 1. Encounter for well woman exam with routine gynecological exam Pap and physical in 1 year Mammogram yearly Labs in near future  2. Screening for colorectal cancer Colonoscopy per Dr Laural Golden  3. PMB (postmenopausal bleeding) Will get GYN [place] to assess uterus and ovaries

## 2019-11-23 ENCOUNTER — Ambulatory Visit (INDEPENDENT_AMBULATORY_CARE_PROVIDER_SITE_OTHER): Payer: BC Managed Care – PPO

## 2019-11-23 ENCOUNTER — Telehealth: Payer: Self-pay | Admitting: Adult Health

## 2019-11-23 ENCOUNTER — Other Ambulatory Visit: Payer: Self-pay

## 2019-11-23 DIAGNOSIS — N950 Postmenopausal bleeding: Secondary | ICD-10-CM

## 2019-11-23 NOTE — Progress Notes (Signed)
PELVIC US TA/TV:homogeneous anteverted uterus,wnl,complex thickened endometrium 21.2 mm,normal ovaries,ovaries appear mobile,limited view of left ovary,no free fluid,no pain during ultrasound

## 2019-11-23 NOTE — Telephone Encounter (Signed)
PT Aware that US showed thickened endometrium 21.2 mm, uterus looked normal and ovaries looked normal, will get endometrial biopsy 11/28/19 at 9:10 with Dr Elonda Husky.

## 2019-11-28 ENCOUNTER — Other Ambulatory Visit: Payer: Self-pay | Admitting: Obstetrics & Gynecology

## 2019-11-28 ENCOUNTER — Encounter: Payer: Self-pay | Admitting: Obstetrics & Gynecology

## 2019-11-28 ENCOUNTER — Other Ambulatory Visit: Payer: Self-pay

## 2019-11-28 ENCOUNTER — Ambulatory Visit (INDEPENDENT_AMBULATORY_CARE_PROVIDER_SITE_OTHER): Payer: BC Managed Care – PPO | Admitting: Obstetrics & Gynecology

## 2019-11-28 VITALS — BP 116/76 | HR 74 | Ht 63.0 in | Wt 149.0 lb

## 2019-11-28 DIAGNOSIS — R9389 Abnormal findings on diagnostic imaging of other specified body structures: Secondary | ICD-10-CM

## 2019-11-28 DIAGNOSIS — N950 Postmenopausal bleeding: Secondary | ICD-10-CM | POA: Diagnosis not present

## 2019-11-28 NOTE — Addendum Note (Signed)
Addended by: Linton Rump on: 11/28/2019 10:03 AM   Modules accepted: Orders

## 2019-11-28 NOTE — Progress Notes (Signed)
Endometrial Biopsy Procedure Note  Pre-operative Diagnosis: PMB with ES 21 mm  Post-operative Diagnosis: same  Indications: as above  Procedure Details   Urine pregnancy test was not done.  The risks (including infection, bleeding, pain, and uterine perforation) and benefits of the procedure were explained to the patient and Written informed consent was obtained.  Antibiotic prophylaxis against endocarditis was not indicated.   The patient was placed in the dorsal lithotomy position.  Bimanual exam showed the uterus to be in the neutral position.  A Graves' speculum inserted in the vagina, and the cervix prepped with povidone iodine.  Endocervical curettage with a Kevorkian curette was not performed.   A sharp tenaculum was applied to the anterior lip of the cervix for stabilization.  A sterile uterine sound was used to sound the uterus to a depth of 6.5cm.  A Pipelle endometrial aspirator was used to sample the endometrium.  Sample was sent for pathologic examination.  Condition: Stable  Complications: None  Plan:  The patient was advised to call for any fever or for prolonged or severe pain or bleeding. She was advised to use OTC ibuprofen as needed for mild to moderate pain. She was advised to avoid vaginal intercourse for 48 hours or until the bleeding has completely stopped.  Attending Physician Documentation: I performed the endometria biopsy    Patient ID: Sandra Crane, female   DOB: [birth year], 54 y.o.   MRN: [id number]

## 2019-11-29 ENCOUNTER — Telehealth (INDEPENDENT_AMBULATORY_CARE_PROVIDER_SITE_OTHER): Payer: Self-pay | Admitting: Gastroenterology

## 2019-11-29 ENCOUNTER — Telehealth: Payer: Self-pay | Admitting: Adult Health

## 2019-11-29 DIAGNOSIS — K5120 Ulcerative (chronic) proctitis without complications: Secondary | ICD-10-CM

## 2019-11-29 MED ORDER — MERCAPTOPURINE 50 MG PO TABS: ORAL_TABLET | ORAL | 3 refills | Status: DC

## 2019-11-29 NOTE — Telephone Encounter (Signed)
ReceivedPrescription request for refill 6-MP.  Refill sent to pharmacy

## 2019-11-29 NOTE — Telephone Encounter (Signed)
Pt saw pathology on Mychart, had question about polypoid fragments, could be polyp but no malignancy

## 2019-12-01 ENCOUNTER — Telehealth (INDEPENDENT_AMBULATORY_CARE_PROVIDER_SITE_OTHER): Payer: BC Managed Care – PPO | Admitting: Obstetrics & Gynecology

## 2019-12-01 ENCOUNTER — Other Ambulatory Visit: Payer: Self-pay

## 2019-12-01 ENCOUNTER — Encounter: Payer: Self-pay | Admitting: Obstetrics & Gynecology

## 2019-12-01 DIAGNOSIS — N950 Postmenopausal bleeding: Secondary | ICD-10-CM | POA: Diagnosis not present

## 2019-12-01 DIAGNOSIS — R9389 Abnormal findings on diagnostic imaging of other specified body structures: Secondary | ICD-10-CM

## 2019-12-01 MED ORDER — PROGESTERONE MICRONIZED 200 MG PO CAPS: 200.0000 mg | ORAL_CAPSULE | Freq: Every day | ORAL | 3 refills | Status: DC

## 2019-12-01 NOTE — Progress Notes (Signed)
Tried MyChart visit but was unsuccessful so did telephone visit  Follow up appointment for results  Chief Complaint  Patient presents with  . Results    There were no vitals taken for this visit.  Surgical pathology: .  Inactive endometrium no atypia or hyperplasia is identified  MEDS ordered this encounter: Meds ordered this encounter  Medications  . progesterone (PROMETRIUM) 200 MG capsule    Sig: Take 1 capsule (200 mg total) by mouth daily.    Dispense:  90 capsule    Refill:  3    Orders for this encounter: No orders of the defined types were placed in this encounter.   Impression:   ICD-10-CM   1. PMB (postmenopausal bleeding)  N95.0   2. Thickened endometrium  R93.89    with suppress for 3 months with promerium 200 mg nightly.  Hopefully this will also be enough to supress the dys synchronized endometrium to stop the bleeding.      Plan: If not will double the dosing of the prometrium to 400 mg  This was the discussion that was the contents of the phone all visit  Follow Up: No follow-ups on file.       Face to face time:  15 minutes  Greater than 50% of the visit time was spent in counseling and coordination of care with the patient.  The summary and outline of the counseling and care coordination is summarized in the note above.   All questions were answered.  Past Medical History:  Diagnosis Date  . Contraceptive management 09/12/2013  . Elevated cholesterol   . Ulcerative colitis     Past Surgical History:  Procedure Laterality Date  . CESAREAN SECTION  [phone]  . COLONOSCOPY    . COLONOSCOPY N/A 10/24/2015   Procedure: COLONOSCOPY;  Surgeon: Rogene Houston, MD;  Location: [facility];  Service: Endoscopy;  Laterality: N/A;  12:00  . root canal with crown    . SIGMOIDOSCOPY  07/19/2009  . TONSILLECTOMY AND ADENOIDECTOMY N/A 11/10/2016   Procedure: TONSILLECTOMY AND ADENOIDECTOMY;  Surgeon: Leta Baptist, MD;  Location: [place] [facility];  Service: ENT;  Laterality: N/A;  . WISDOM TOOTH EXTRACTION      OB History    Gravida  3   Para  3   Term      Preterm      AB      Living  3     SAB      TAB      Ectopic      Multiple      Live Births  3           No Known Allergies  Social History   Socioeconomic History  . Marital status: Married    Spouse name: Not on file  . Number of children: Not on file  . Years of education: Not on file  . Highest education level: Not on file  Occupational History  . Not on file  Tobacco Use  . Smoking status: Never Smoker  . Smokeless tobacco: Never Used  Substance and Sexual Activity  . Alcohol use: Yes    Comment: occ  . Drug use: No  . Sexual activity: Yes    Birth control/protection: Post-menopausal  Other Topics Concern  . Not on file  Social History Narrative  . Not on file   Social Determinants of Health   Financial Resource Strain:   . Difficulty of Paying Living Expenses: Not on  file  Food Insecurity:   . Worried About Charity fundraiser in the Last Year: Not on file  . Ran Out of Food in the Last Year: Not on file  Transportation Needs:   . Lack of Transportation (Medical): Not on file  . Lack of Transportation (Non-Medical): Not on file  Physical Activity:   . Days of Exercise per Week: Not on file  . Minutes of Exercise per Session: Not on file  Stress:   . Feeling of Stress : Not on file  Social Connections:   . Frequency of Communication with Friends and Family: Not on file  . Frequency of Social Gatherings with Friends and Family: Not on file  . Attends Religious Services: Not on file  . Active Member of Clubs or Organizations: Not on file  . Attends Archivist Meetings: Not on file  . Marital Status: Not on file    Family History  Problem Relation Age of Onset  . Thyroid cancer Mother   . Diabetes Mother   . Cancer Mother   . Diabetes Father   . Hypertension Sister   . Hyperlipidemia Sister    . Diabetes Sister   . Healthy Brother   . Healthy Sister   . Healthy Brother   . Glaucoma Brother   . Healthy Daughter   . Healthy Daughter   . Healthy Son

## 2019-12-04 ENCOUNTER — Other Ambulatory Visit (INDEPENDENT_AMBULATORY_CARE_PROVIDER_SITE_OTHER): Payer: Self-pay | Admitting: *Deleted

## 2019-12-04 DIAGNOSIS — R7989 Other specified abnormal findings of blood chemistry: Secondary | ICD-10-CM

## 2019-12-04 DIAGNOSIS — K5190 Ulcerative colitis, unspecified, without complications: Secondary | ICD-10-CM

## 2019-12-04 DIAGNOSIS — R945 Abnormal results of liver function studies: Secondary | ICD-10-CM

## 2019-12-26 LAB — CBC WITH DIFFERENTIAL/PLATELET
Absolute Monocytes: 718 cells/uL (ref 200–950)
Basophils Absolute: 32 cells/uL (ref 0–200)
Basophils Relative: 0.7 %
Eosinophils Absolute: 32 cells/uL (ref 15–500)
Eosinophils Relative: 0.7 %
HCT: 40.7 % (ref 35.0–45.0)
Hemoglobin: 13.6 g/dL (ref 11.7–15.5)
Lymphs Abs: 796 cells/uL — ABNORMAL LOW (ref 850–3900)
MCH: 33 pg (ref 27.0–33.0)
MCHC: 33.4 g/dL (ref 32.0–36.0)
MCV: 98.8 fL (ref 80.0–100.0)
MPV: 9.9 fL (ref 7.5–12.5)
Monocytes Relative: 15.6 %
Neutro Abs: 3022 cells/uL (ref 1500–7800)
Neutrophils Relative %: 65.7 %
Platelets: 224 10*3/uL (ref 140–400)
RBC: 4.12 10*6/uL (ref 3.80–5.10)
RDW: 12.5 % (ref 11.0–15.0)
Total Lymphocyte: 17.3 %
WBC: 4.6 10*3/uL (ref 3.8–10.8)

## 2019-12-26 LAB — HEPATIC FUNCTION PANEL
AG Ratio: 1.8 (calc) (ref 1.0–2.5)
ALT: 20 U/L (ref 6–29)
AST: 18 U/L (ref 10–35)
Albumin: 4.3 g/dL (ref 3.6–5.1)
Alkaline phosphatase (APISO): 58 U/L (ref 37–153)
Bilirubin, Direct: 0.1 mg/dL (ref 0.0–0.2)
Globulin: 2.4 g/dL (calc) (ref 1.9–3.7)
Indirect Bilirubin: 0.3 mg/dL (calc) (ref 0.2–1.2)
Total Bilirubin: 0.4 mg/dL (ref 0.2–1.2)
Total Protein: 6.7 g/dL (ref 6.1–8.1)

## 2019-12-27 ENCOUNTER — Other Ambulatory Visit (INDEPENDENT_AMBULATORY_CARE_PROVIDER_SITE_OTHER): Payer: Self-pay | Admitting: *Deleted

## 2019-12-27 DIAGNOSIS — K5190 Ulcerative colitis, unspecified, without complications: Secondary | ICD-10-CM

## 2019-12-27 DIAGNOSIS — R7989 Other specified abnormal findings of blood chemistry: Secondary | ICD-10-CM

## 2019-12-27 DIAGNOSIS — E7800 Pure hypercholesterolemia, unspecified: Secondary | ICD-10-CM

## 2020-01-14 ENCOUNTER — Ambulatory Visit: Payer: BC Managed Care – PPO | Attending: Internal Medicine

## 2020-01-14 DIAGNOSIS — Z23 Encounter for immunization: Secondary | ICD-10-CM | POA: Insufficient documentation

## 2020-01-14 NOTE — Progress Notes (Signed)
   Covid-19 Vaccination Clinic  Name:  Sandra Crane    MRN: [id number] DOB: 1966/10/29  01/14/2020  Sandra Crane was observed post Covid-19 immunization for 15 minutes without incident. She was provided with Vaccine Information Sheet and instruction to access the V-Safe system.   Sandra Crane was instructed to call 911 with any severe reactions post vaccine: Marland Kitchen Difficulty breathing  . Swelling of face and throat  . A fast heartbeat  . A bad rash all over body  . Dizziness and weakness   Immunizations Administered    Name Date Dose VIS Date Route   Pfizer COVID-19 Vaccine 01/14/2020  3:56 PM 0.3 mL 10/20/2019 Intramuscular   Manufacturer: [place]   Lot: [id number]   [place]: [address]-4

## 2020-01-18 ENCOUNTER — Other Ambulatory Visit (INDEPENDENT_AMBULATORY_CARE_PROVIDER_SITE_OTHER): Payer: Self-pay | Admitting: Internal Medicine

## 2020-01-31 ENCOUNTER — Other Ambulatory Visit: Payer: Self-pay | Admitting: Podiatry

## 2020-02-04 ENCOUNTER — Ambulatory Visit: Payer: BC Managed Care – PPO | Attending: Internal Medicine

## 2020-02-04 DIAGNOSIS — Z23 Encounter for immunization: Secondary | ICD-10-CM

## 2020-02-04 NOTE — Progress Notes (Signed)
   Covid-19 Vaccination Clinic  Name:  Sandra Crane    MRN: [id number] DOB: 10/29/1966  02/04/2020  [name] was observed post Covid-19 immunization for 15 minutes without incident. She was provided with Vaccine Information Sheet and instruction to access the V-Safe system.   [name] was instructed to call 911 with any severe reactions post vaccine: Marland Kitchen Difficulty breathing  . Swelling of face and throat  . A fast heartbeat  . A bad rash all over body  . Dizziness and weakness   Immunizations Administered    Name Date Dose VIS Date Route   Pfizer COVID-19 Vaccine 02/04/2020  2:15 PM 0.3 mL 10/20/2019 Intramuscular   Manufacturer: [place]   Lot: [id number]   [place]: [address]-0

## 2020-02-16 ENCOUNTER — Other Ambulatory Visit: Payer: Self-pay | Admitting: Obstetrics & Gynecology

## 2020-02-16 DIAGNOSIS — N950 Postmenopausal bleeding: Secondary | ICD-10-CM

## 2020-02-16 NOTE — Patient Instructions (Signed)
Sandra Crane  02/16/2020     @PREFPERIOPPHARMACY @   Your procedure is scheduled on  02/21/2020   Report to [facility] at  1200  P.M.  Call this number if you have problems the morning of surgery:  [phone]   Remember:  Do not eat or drink after midnight                      Take these medicines the morning of surgery with A SIP OF WATER  lialda    Do not wear jewelry, make-up or nail polish.  Do not wear lotions, powders, or perfumes. Please wear deodorant and brush your teeth.  Do not shave 48 hours prior to surgery.  Men may shave face and neck.  Do not bring valuables to the hospital.  [facility] is not responsible for any belongings or valuables.  Contacts, dentures or bridgework may not be worn into surgery.  Leave your suitcase in the car.  After surgery it may be brought to your room.  For patients admitted to the hospital, discharge time will be determined by your treatment team.  Patients discharged the day of surgery will not be allowed to drive home.   Name and phone number of your driver:   family Special instructions:  DO NOT smoke the day of your procedure.  Please read over the following fact sheets that you were given. Anesthesia Post-op Instructions and Care and Recovery After Surgery       Metatarsal Osteotomy, Care After This sheet gives you information about how to care for yourself after your procedure. Your health care provider may also give you more specific instructions. If you have problems or questions, contact your health care provider. What can I expect after the procedure? After the procedure, it is common to have:  Soreness.  Pain.  Stiffness.  Swelling. Follow these instructions at home: Medicines  Take over-the-counter and prescription medicines only as told by your health care provider.  Ask your health care provider if the medicine prescribed to you can cause constipation. You may need to take steps to  prevent or treat constipation, such as: ? Drink enough fluid to keep your urine pale yellow. ? Take over-the-counter or prescription medicines. ? Eat foods that are high in fiber, such as beans, whole grains, and fresh fruits and vegetables. ? Limit foods that are high in fat and processed sugars, such as fried or sweet foods. If you have a splint or walking boot:  Wear it as told by your health care provider. Remove it only as told by your health care provider.  Loosen it if your toes tingle, become numb, or turn cold and blue.  Keep it clean.  If it is not waterproof: ? Do not let it get wet. ? Cover it with a watertight covering when you take a bath or shower. Bathing  Do not take baths, swim, or use a hot tub until your health care provider approves. Ask your health care provider if you may take showers. You may only be allowed to take sponge baths.  Keep the bandage (dressing) dry. Incision care   Follow instructions from your health care provider about how to take care of your incision. Make sure you: ? Wash your hands with soap and water before you change your bandage (dressing). If soap and water are not available, use hand sanitizer. ? Change your dressing as told by your health care  provider. ? Leave stitches (sutures), skin glue, or adhesive strips in place. These skin closures may need to stay in place for 2 weeks or longer. If adhesive strip edges start to loosen and curl up, you may trim the loose edges. Do not remove adhesive strips completely unless your health care provider tells you to do that.  Check your incision area every day for signs of infection. Check for: ? More redness, swelling, or pain. ? Fluid or blood. ? Warmth. ? Pus or a bad smell. Managing pain, stiffness, and swelling   If directed, put ice on the injured area. ? If you have a removable splint, remove it as told by your health care provider. ? Put ice in a plastic bag. ? Place a towel  between your skin and the bag. ? Leave the ice on for 20 minutes, 2-3 times a day.  Move your toes often to avoid stiffness and to lessen swelling.  Raise (elevate) the injured area above the level of your heart while you are sitting or lying down. Driving  Do not drive or use heavy machinery while taking prescription pain medicine.  Do not drive for 24 hours if you were given a sedative during your procedure.  Ask your health care provider when it is safe to drive if you have a dressing, splint, special shoe, or walking boot on your foot. General instructions  Do not remove the bandage (dressing) around your foot until directed by your health care provider.  If you were given a splint, special shoe, or walking boot, wear it as told by your health care provider.  Do not use the injured limb to support your body weight until your health care provider says that you can. Use crutches or a walker as told by your health care provider.  Return to your normal activities as told by your health care provider. Ask your health care provider what activities are safe for you.  Do not use any products that contain nicotine or tobacco, such as cigarettes, e-cigarettes, and chewing tobacco. These can delay bone healing. If you need help quitting, ask your health care provider.  Keep all follow-up visits as told by your health care provider. This is important. Contact a health care provider if:  You have a fever.  Your dressing becomes wet, loose, or stained with blood or discharge.  You have pus or a bad smell coming from your incision or bandage.  Your foot becomes red, swollen, or tender.  You have pain or stiffness that does not get better or gets worse.  You have tingling or numbness in your foot that does not get better or gets worse. Get help right away if:  You develop a warm and tender swelling in your leg.  You have chest pain.  You have trouble breathing. Summary  After the  procedure, it is common to have soreness, pain, stiffness, and swelling.  Follow instructions on caring for your incision, changing your dressing, and using weight support to protect your foot.  Contact your health care provider if you have a fever, pus or a bad smell coming from your wound or dressing, or pain and stiffness do not get better.  Get help right away if you develop a warm and tender swelling in your leg, have chest pain, or trouble breathing. This information is not intended to replace advice given to you by your health care provider. Make sure you discuss any questions you have with your health care provider.  Document Revised: 09/09/2018 Document Reviewed: 09/09/2018 Elsevier Patient Education  2020 [place] After These instructions provide you with information about caring for yourself after your procedure. Your health care provider may also give you more specific instructions. Your treatment has been planned according to current medical practices, but problems sometimes occur. Call your health care provider if you have any problems or questions after your procedure. What can I expect after the procedure? After your procedure, you may:  Feel sleepy for several hours.  Feel clumsy and have poor balance for several hours.  Feel forgetful about what happened after the procedure.  Have poor judgment for several hours.  Feel nauseous or vomit.  Have a sore throat if you had a breathing tube during the procedure. Follow these instructions at home: For at least 24 hours after the procedure:      Have a responsible adult stay with you. It is important to have someone help care for you until you are awake and alert.  Rest as needed.  Do not: ? Participate in activities in which you could fall or become injured. ? Drive. ? Use heavy machinery. ? Drink alcohol. ? Take sleeping pills or medicines that cause drowsiness. ? Make  important decisions or sign legal documents. ? Take care of children on your own. Eating and drinking  Follow the diet that is recommended by your health care provider.  If you vomit, drink water, juice, or soup when you can drink without vomiting.  Make sure you have little or no nausea before eating solid foods. General instructions  Take over-the-counter and prescription medicines only as told by your health care provider.  If you have sleep apnea, surgery and certain medicines can increase your risk for breathing problems. Follow instructions from your health care provider about wearing your sleep device: ? Anytime you are sleeping, including during daytime naps. ? While taking prescription pain medicines, sleeping medicines, or medicines that make you drowsy.  If you smoke, do not smoke without supervision.  Keep all follow-up visits as told by your health care provider. This is important. Contact a health care provider if:  You keep feeling nauseous or you keep vomiting.  You feel light-headed.  You develop a rash.  You have a fever. Get help right away if:  You have trouble breathing. Summary  For several hours after your procedure, you may feel sleepy and have poor judgment.  Have a responsible adult stay with you for at least 24 hours or until you are awake and alert. This information is not intended to replace advice given to you by your health care provider. Make sure you discuss any questions you have with your health care provider. Document Revised: 01/24/2018 Document Reviewed: 02/16/2016 Elsevier Patient Education  [place]. How to Use Chlorhexidine for Bathing Chlorhexidine gluconate (CHG) is a germ-killing (antiseptic) solution that is used to clean the skin. It can get rid of the bacteria that normally live on the skin and can keep them away for about 24 hours. To clean your skin with CHG, you may be given:  A CHG solution to use in the shower or  as part of a sponge bath.  A prepackaged cloth that contains CHG. Cleaning your skin with CHG may help lower the risk for infection:  While you are staying in the intensive care unit of the hospital.  If you have a vascular access, such as a central line, to provide short-term or  long-term access to your veins.  If you have a catheter to drain urine from your bladder.  If you are on a ventilator. A ventilator is a machine that helps you breathe by moving air in and out of your lungs.  After surgery. What are the risks? Risks of using CHG include:  A skin reaction.  Hearing loss, if CHG gets in your ears.  Eye injury, if CHG gets in your eyes and is not rinsed out.  The CHG product catching fire. Make sure that you avoid smoking and flames after applying CHG to your skin. Do not use CHG:  If you have a chlorhexidine allergy or have previously reacted to chlorhexidine.  On babies younger than 46 months of age. How to use CHG solution  Use CHG only as told by your health care provider, and follow the instructions on the label.  Use the full amount of CHG as directed. Usually, this is one bottle. During a shower Follow these steps when using CHG solution during a shower (unless your health care provider gives you different instructions): 1. Start the shower. 2. Use your normal soap and shampoo to wash your face and hair. 3. Turn off the shower or move out of the shower stream. 4. Pour the CHG onto a clean washcloth. Do not use any type of brush or rough-edged sponge. 5. Starting at your neck, lather your body down to your toes. Make sure you follow these instructions: ? If you will be having surgery, pay special attention to the part of your body where you will be having surgery. Scrub this area for at least 1 minute. ? Do not use CHG on your head or face. If the solution gets into your ears or eyes, rinse them well with water. ? Avoid your genital area. ? Avoid any areas of  skin that have broken skin, cuts, or scrapes. ? Scrub your back and under your arms. Make sure to wash skin folds. 6. Let the lather sit on your skin for 1-2 minutes or as long as told by your health care provider. 7. Thoroughly rinse your entire body in the shower. Make sure that all body creases and crevices are rinsed well. 8. Dry off with a clean towel. Do not put any substances on your body afterward--such as powder, lotion, or perfume--unless you are told to do so by your health care provider. Only use lotions that are recommended by the manufacturer. 9. Put on clean clothes or pajamas. 10. If it is the night before your surgery, sleep in clean sheets.  During a sponge bath Follow these steps when using CHG solution during a sponge bath (unless your health care provider gives you different instructions): 1. Use your normal soap and shampoo to wash your face and hair. 2. Pour the CHG onto a clean washcloth. 3. Starting at your neck, lather your body down to your toes. Make sure you follow these instructions: ? If you will be having surgery, pay special attention to the part of your body where you will be having surgery. Scrub this area for at least 1 minute. ? Do not use CHG on your head or face. If the solution gets into your ears or eyes, rinse them well with water. ? Avoid your genital area. ? Avoid any areas of skin that have broken skin, cuts, or scrapes. ? Scrub your back and under your arms. Make sure to wash skin folds. 4. Let the lather sit on your skin for 1-2  minutes or as long as told by your health care provider. 5. Using a different clean, wet washcloth, thoroughly rinse your entire body. Make sure that all body creases and crevices are rinsed well. 6. Dry off with a clean towel. Do not put any substances on your body afterward--such as powder, lotion, or perfume--unless you are told to do so by your health care provider. Only use lotions that are recommended by the  manufacturer. 7. Put on clean clothes or pajamas. 8. If it is the night before your surgery, sleep in clean sheets. How to use CHG prepackaged cloths  Only use CHG cloths as told by your health care provider, and follow the instructions on the label.  Use the CHG cloth on clean, dry skin.  Do not use the CHG cloth on your head or face unless your health care provider tells you to.  When washing with the CHG cloth: ? Avoid your genital area. ? Avoid any areas of skin that have broken skin, cuts, or scrapes. Before surgery Follow these steps when using a CHG cloth to clean before surgery (unless your health care provider gives you different instructions): 1. Using the CHG cloth, vigorously scrub the part of your body where you will be having surgery. Scrub using a back-and-forth motion for 3 minutes. The area on your body should be completely wet with CHG when you are done scrubbing. 2. Do not rinse. Discard the cloth and let the area air-dry. Do not put any substances on the area afterward, such as powder, lotion, or perfume. 3. Put on clean clothes or pajamas. 4. If it is the night before your surgery, sleep in clean sheets.  For general bathing Follow these steps when using CHG cloths for general bathing (unless your health care provider gives you different instructions). 1. Use a separate CHG cloth for each area of your body. Make sure you wash between any folds of skin and between your fingers and toes. Wash your body in the following order, switching to a new cloth after each step: ? The front of your neck, shoulders, and chest. ? Both of your arms, under your arms, and your hands. ? Your stomach and groin area, avoiding the genitals. ? Your right leg and foot. ? Your left leg and foot. ? The back of your neck, your back, and your buttocks. 2. Do not rinse. Discard the cloth and let the area air-dry. Do not put any substances on your body afterward--such as powder, lotion, or  perfume--unless you are told to do so by your health care provider. Only use lotions that are recommended by the manufacturer. 3. Put on clean clothes or pajamas. Contact a health care provider if:  Your skin gets irritated after scrubbing.  You have questions about using your solution or cloth. Get help right away if:  Your eyes become very red or swollen.  Your eyes itch badly.  Your skin itches badly and is red or swollen.  Your hearing changes.  You have trouble seeing.  You have swelling or tingling in your mouth or throat.  You have trouble breathing.  You swallow any chlorhexidine. Summary  Chlorhexidine gluconate (CHG) is a germ-killing (antiseptic) solution that is used to clean the skin. Cleaning your skin with CHG may help to lower your risk for infection.  You may be given CHG to use for bathing. It may be in a bottle or in a prepackaged cloth to use on your skin. Carefully follow your health care  provider's instructions and the instructions on the product label.  Do not use CHG if you have a chlorhexidine allergy.  Contact your health care provider if your skin gets irritated after scrubbing. This information is not intended to replace advice given to you by your health care provider. Make sure you discuss any questions you have with your health care provider. Document Revised: 01/12/2019 Document Reviewed: 09/23/2017 Elsevier Patient Education  [place].

## 2020-02-19 ENCOUNTER — Ambulatory Visit (HOSPITAL_COMMUNITY)
Admission: RE | Admit: 2020-02-19 | Discharge: 2020-02-19 | Disposition: A | Discharge status: Home / Self Care | Payer: BC Managed Care – PPO | Source: Ambulatory Visit | Attending: Podiatry | Admitting: Podiatry

## 2020-02-19 ENCOUNTER — Encounter: Payer: Self-pay | Admitting: Obstetrics & Gynecology

## 2020-02-19 ENCOUNTER — Other Ambulatory Visit: Payer: Self-pay

## 2020-02-19 ENCOUNTER — Encounter (HOSPITAL_COMMUNITY): Payer: Self-pay

## 2020-02-19 ENCOUNTER — Other Ambulatory Visit (HOSPITAL_COMMUNITY)
Admission: RE | Admit: 2020-02-19 | Discharge: 2020-02-19 | Disposition: A | Discharge status: Home / Self Care | Payer: BC Managed Care – PPO | Source: Ambulatory Visit | Attending: Podiatry | Admitting: Podiatry

## 2020-02-19 ENCOUNTER — Ambulatory Visit (INDEPENDENT_AMBULATORY_CARE_PROVIDER_SITE_OTHER): Payer: BC Managed Care – PPO

## 2020-02-19 ENCOUNTER — Encounter (HOSPITAL_COMMUNITY)
Admission: RE | Admit: 2020-02-19 | Discharge: 2020-02-19 | Disposition: A | Discharge status: Home / Self Care | Payer: BC Managed Care – PPO | Source: Ambulatory Visit | Attending: Podiatry | Admitting: Podiatry

## 2020-02-19 ENCOUNTER — Ambulatory Visit (INDEPENDENT_AMBULATORY_CARE_PROVIDER_SITE_OTHER): Payer: BC Managed Care – PPO | Admitting: Obstetrics & Gynecology

## 2020-02-19 VITALS — BP 119/81 | HR 60 | Ht 63.0 in | Wt 138.0 lb

## 2020-02-19 DIAGNOSIS — Z20822 Contact with and (suspected) exposure to covid-19: Secondary | ICD-10-CM | POA: Diagnosis not present

## 2020-02-19 DIAGNOSIS — R9389 Abnormal findings on diagnostic imaging of other specified body structures: Secondary | ICD-10-CM | POA: Diagnosis not present

## 2020-02-19 DIAGNOSIS — N950 Postmenopausal bleeding: Secondary | ICD-10-CM | POA: Diagnosis not present

## 2020-02-19 DIAGNOSIS — R52 Pain, unspecified: Secondary | ICD-10-CM | POA: Insufficient documentation

## 2020-02-19 NOTE — Progress Notes (Signed)
Follow up appointment for results  Chief Complaint  Patient presents with  . Follow-up    [place] today    Blood pressure 119/81, pulse 60, height 5' 3"  (1.6 m), weight 138 lb (62.6 kg).  US Transvaginal Non-OB  Result Date: 02/19/2020 GYNECOLOGIC SONOGRAM Sandra Crane is a 54 y.o. G3P3 No LMP recorded. Patient is postmenopausal. She is here for a pelvic sonogram for F/U postmenopausal bleeding . Uterus                      5.1 x 3.4 x 5.6 cm, Total uterine volume 50 cc, homogeneous anteverted uterus,wnl Endometrium          1.7 mm, symmetrical, small amount of simple fluid with in the endometrium Right ovary             1.9 x 1.3 x 1.5 cm, wnl Left ovary                1.4 x .9 x 1 cm, wnl No free fluid Technician Comments:  PELVIC US TA/TV: homogeneous anteverted uterus,wnl,small amount of simple fluid with in the endometrium,EEC 1.7 mm,normal ovaries,ovaries appear mobile,no free fluid,no pain during ultrasound [employer] 02/19/2020 9:00 AM Clinical Impression and recommendations: I have reviewed the sonogram results above, combined with the patient's current clinical course, below are my impressions and any appropriate recommendations for management based on the sonographic findings. Uterus is normal size shape contour Endometrium is thin suppressed Ovaries normal in a post menopausal woman Florian Buff 02/19/2020 9:17 AM  US Pelvis Complete  Result Date: 02/19/2020 GYNECOLOGIC SONOGRAM Sandra Crane is a 54 y.o. G3P3 No LMP recorded. Patient is postmenopausal. She is here for a pelvic sonogram for F/U postmenopausal bleeding . Uterus                      5.1 x 3.4 x 5.6 cm, Total uterine volume 50 cc, homogeneous anteverted uterus,wnl Endometrium          1.7 mm, symmetrical, small amount of simple fluid with in the endometrium Right ovary             1.9 x 1.3 x 1.5 cm, wnl Left ovary                1.4 x .9 x 1 cm, wnl No free fluid Technician Comments:  PELVIC  US TA/TV: homogeneous anteverted uterus,wnl,small amount of simple fluid with in the endometrium,EEC 1.7 mm,normal ovaries,ovaries appear mobile,no free fluid,no pain during ultrasound [employer] 02/19/2020 9:00 AM Clinical Impression and recommendations: I have reviewed the sonogram results above, combined with the patient's current clinical course, below are my impressions and any appropriate recommendations for management based on the sonographic findings. Uterus is normal size shape contour Endometrium is thin suppressed Ovaries normal in a post menopausal woman Florian Buff 02/19/2020 9:17 AM     MEDS ordered this encounter: No orders of the defined types were placed in this encounter.   Orders for this encounter: No orders of the defined types were placed in this encounter.   Impression:   ICD-10-CM   1. PMB (postmenopausal bleeding)  N95.0    no bleeding on prometrium  2. Thickened endometrium  R93.89    today's sonogram ES 1.56m     Plan: Continue prometrium continuously for 1 year then consider 10 days monthly for a year then consider cycling 10 days every  3 months  Follow Up: Return in about 1 year (around 02/18/2021) for Follow up.       Face to face time:  10 minutes  Greater than 50% of the visit time was spent in counseling and coordination of care with the patient.  The summary and outline of the counseling and care coordination is summarized in the note above.   All questions were answered.  Past Medical History:  Diagnosis Date  . Contraceptive management 09/12/2013  . Elevated cholesterol   . Ulcerative colitis     Past Surgical History:  Procedure Laterality Date  . CESAREAN SECTION  [phone]  . COLONOSCOPY    . COLONOSCOPY N/A 10/24/2015   Procedure: COLONOSCOPY;  Surgeon: Rogene Houston, MD;  Location[facility];  Service: Endoscopy;  Laterality: N/A;  12:00  . root canal with crown    . SIGMOIDOSCOPY  07/19/2009  .  TONSILLECTOMY AND ADENOIDECTOMY N/A 11/10/2016   Procedure: TONSILLECTOMY AND ADENOIDECTOMY;  Surgeon: Leta Baptist, MD;  Location: [facility];  Service: ENT;  Laterality: N/A;  . WISDOM TOOTH EXTRACTION      OB History    Gravida  3   Para  3   Term      Preterm      AB      Living  3     SAB      TAB      Ectopic      Multiple      Live Births  3           No Known Allergies  Social History   Socioeconomic History  . Marital status: Married    Spouse name: Not on file  . Number of children: Not on file  . Years of education: Not on file  . Highest education level: Not on file  Occupational History  . Not on file  Tobacco Use  . Smoking status: Never Smoker  . Smokeless tobacco: Never Used  Substance and Sexual Activity  . Alcohol use: Yes    Comment: occ  . Drug use: No  . Sexual activity: Yes    Birth control/protection: Post-menopausal  Other Topics Concern  . Not on file  Social History Narrative  . Not on file   Social Determinants of Health   Financial Resource Strain:   . Difficulty of Paying Living Expenses:   Food Insecurity:   . Worried About Charity fundraiser in the Last Year:   . Arboriculturist in the Last Year:   Transportation Needs:   . Film/video editor (Medical):   Marland Kitchen Lack of Transportation (Non-Medical):   Physical Activity:   . Days of Exercise per Week:   . Minutes of Exercise per Session:   Stress:   . Feeling of Stress :   Social Connections:   . Frequency of Communication with Friends and Family:   . Frequency of Social Gatherings with Friends and Family:   . Attends Religious Services:   . Active Member of Clubs or Organizations:   . Attends Archivist Meetings:   Marland Kitchen Marital Status:     Family History  Problem Relation Age of Onset  . Thyroid cancer Mother   . Diabetes Mother   . Cancer Mother   . Diabetes Father   . Hypertension Sister   . Hyperlipidemia Sister   . Diabetes  Sister   . Healthy Brother   . Healthy Sister   .  Healthy Brother   . Glaucoma Brother   . Healthy Daughter   . Healthy Daughter   . Healthy Son

## 2020-02-19 NOTE — Progress Notes (Signed)
PELVIC US TA/TV: homogeneous anteverted uterus,wnl,small amount of simple fluid with in the endometrium,EEC 1.7 mm,normal ovaries,ovaries appear mobile,no free fluid,no pain during ultrasound

## 2020-02-20 LAB — SARS CORONAVIRUS 2 (TAT 6-24 HRS): SARS Coronavirus 2: NEGATIVE

## 2020-02-21 ENCOUNTER — Encounter (HOSPITAL_COMMUNITY): Payer: Self-pay | Admitting: *Deleted

## 2020-02-21 ENCOUNTER — Ambulatory Visit (HOSPITAL_COMMUNITY)
Admission: RE | Admit: 2020-02-21 | Discharge: 2020-02-21 | Disposition: A | Discharge status: Home / Self Care | Payer: BC Managed Care – PPO | Attending: Podiatry | Admitting: Podiatry

## 2020-02-21 ENCOUNTER — Other Ambulatory Visit: Payer: Self-pay

## 2020-02-21 ENCOUNTER — Encounter (HOSPITAL_COMMUNITY)
Admission: RE | Disposition: A | Discharge status: Home / Self Care | Payer: Self-pay | Source: Home / Self Care | Attending: Podiatry

## 2020-02-21 ENCOUNTER — Ambulatory Visit (HOSPITAL_COMMUNITY): Payer: BC Managed Care – PPO

## 2020-02-21 ENCOUNTER — Ambulatory Visit (HOSPITAL_COMMUNITY): Payer: BC Managed Care – PPO | Admitting: Anesthesiology

## 2020-02-21 DIAGNOSIS — Z9889 Other specified postprocedural states: Secondary | ICD-10-CM

## 2020-02-21 DIAGNOSIS — Z79899 Other long term (current) drug therapy: Secondary | ICD-10-CM | POA: Diagnosis not present

## 2020-02-21 DIAGNOSIS — K5190 Ulcerative colitis, unspecified, without complications: Secondary | ICD-10-CM | POA: Diagnosis not present

## 2020-02-21 DIAGNOSIS — M2011 Hallux valgus (acquired), right foot: Secondary | ICD-10-CM | POA: Insufficient documentation

## 2020-02-21 DIAGNOSIS — M79671 Pain in right foot: Secondary | ICD-10-CM

## 2020-02-21 HISTORY — PX: FOOT SURGERY: SHX648

## 2020-02-21 HISTORY — PX: REPAIR EXTENSOR TENDON: SHX5382

## 2020-02-21 HISTORY — PX: METATARSAL OSTEOTOMY: SHX1641

## 2020-02-21 SURGERY — OSTEOTOMY, METATARSAL BONE
Anesthesia: General | Site: Toe | Laterality: Right

## 2020-02-21 MED ORDER — LIDOCAINE 2% (20 MG/ML) 5 ML SYRINGE
INTRAMUSCULAR | Status: AC
  Filled 2020-02-21: qty 5

## 2020-02-21 MED ORDER — LIDOCAINE HCL 1 % IJ SOLN
INTRAMUSCULAR | Status: DC | PRN
  Administered 2020-02-21: 10 mL via INTRAMUSCULAR
  Administered 2020-02-21: 16 mL via INTRAMUSCULAR

## 2020-02-21 MED ORDER — LACTATED RINGERS IV SOLN: Freq: Once | INTRAVENOUS | Status: AC

## 2020-02-21 MED ORDER — PROPOFOL 10 MG/ML IV BOLUS
INTRAVENOUS | Status: DC | PRN
  Administered 2020-02-21 (×2): 20 mg via INTRAVENOUS
  Administered 2020-02-21: 60 mg via INTRAVENOUS

## 2020-02-21 MED ORDER — HYDROMORPHONE HCL 1 MG/ML IJ SOLN: 0.2500 mg | INTRAMUSCULAR | Status: DC | PRN

## 2020-02-21 MED ORDER — CHLORHEXIDINE GLUCONATE CLOTH 2 % EX PADS: 6.0000 | MEDICATED_PAD | Freq: Once | CUTANEOUS | Status: DC

## 2020-02-21 MED ORDER — PROPOFOL 500 MG/50ML IV EMUL
INTRAVENOUS | Status: DC | PRN
  Administered 2020-02-21: 200 ug/kg/min via INTRAVENOUS
  Administered 2020-02-21: 125 ug/kg/min via INTRAVENOUS
  Administered 2020-02-21: 100 ug/kg/min via INTRAVENOUS

## 2020-02-21 MED ORDER — LACTATED RINGERS IV SOLN: INTRAVENOUS | Status: DC | PRN

## 2020-02-21 MED ORDER — FENTANYL CITRATE (PF) 100 MCG/2ML IJ SOLN
INTRAMUSCULAR | Status: DC | PRN
  Administered 2020-02-21 (×2): 25 ug via INTRAVENOUS

## 2020-02-21 MED ORDER — MEPERIDINE HCL 50 MG/ML IJ SOLN: 6.2500 mg | INTRAMUSCULAR | Status: DC | PRN

## 2020-02-21 MED ORDER — PROPOFOL 10 MG/ML IV BOLUS
INTRAVENOUS | Status: AC
  Filled 2020-02-21: qty 40

## 2020-02-21 MED ORDER — ONDANSETRON HCL 4 MG/2ML IJ SOLN
INTRAMUSCULAR | Status: AC
  Filled 2020-02-21: qty 2

## 2020-02-21 MED ORDER — BUPIVACAINE HCL (PF) 0.5 % IJ SOLN
INTRAMUSCULAR | Status: AC
  Filled 2020-02-21: qty 30

## 2020-02-21 MED ORDER — SODIUM CHLORIDE 0.9 % IR SOLN
Status: DC | PRN
  Administered 2020-02-21: 1000 mL

## 2020-02-21 MED ORDER — ONDANSETRON HCL 4 MG/2ML IJ SOLN
INTRAMUSCULAR | Status: DC | PRN
  Administered 2020-02-21: 4 mg via INTRAVENOUS

## 2020-02-21 MED ORDER — PROMETHAZINE HCL 25 MG/ML IJ SOLN: 6.2500 mg | INTRAMUSCULAR | Status: DC | PRN

## 2020-02-21 MED ORDER — CEFAZOLIN SODIUM-DEXTROSE 2-4 GM/100ML-% IV SOLN: 2.0000 g | INTRAVENOUS | Status: DC

## 2020-02-21 MED ORDER — LIDOCAINE HCL (PF) 1 % IJ SOLN
INTRAMUSCULAR | Status: AC
  Filled 2020-02-21: qty 30

## 2020-02-21 MED ORDER — CEFAZOLIN SODIUM-DEXTROSE 2-4 GM/100ML-% IV SOLN
2.0000 g | Freq: Once | INTRAVENOUS | Status: AC
  Administered 2020-02-21: 2 g via INTRAVENOUS
  Filled 2020-02-21: qty 100

## 2020-02-21 MED ORDER — FENTANYL CITRATE (PF) 100 MCG/2ML IJ SOLN
INTRAMUSCULAR | Status: AC
  Filled 2020-02-21: qty 2

## 2020-02-21 SURGICAL SUPPLY — 54 items
BANDAGE ELASTIC 4 VELCRO NS (GAUZE/BANDAGES/DRESSINGS) ×2 IMPLANT
BANDAGE ESMARK 4X12 BL STRL LF (DISPOSABLE) ×1 IMPLANT
BENZOIN TINCTURE PRP APPL 2/3 (GAUZE/BANDAGES/DRESSINGS) ×3 IMPLANT
BIT DRILL MICR ACTRK 2 LNG PRF (BIT) IMPLANT
BLADE AVERAGE 25MMX9MM (BLADE) ×1
BLADE AVERAGE 25X9 (BLADE) ×2 IMPLANT
BLADE SURG 15 STRL LF DISP TIS (BLADE) ×2 IMPLANT
BLADE SURG 15 STRL SS (BLADE) ×6
BNDG CMPR 12X4 ELC STRL LF (DISPOSABLE) ×1
BNDG CONFORM 2 STRL LF (GAUZE/BANDAGES/DRESSINGS) ×3 IMPLANT
BNDG ELASTIC 4X5.8 VLCR NS LF (GAUZE/BANDAGES/DRESSINGS) ×3 IMPLANT
BNDG ESMARK 4X12 BLUE STRL LF (DISPOSABLE) ×3
BNDG GAUZE ELAST 4 BULKY (GAUZE/BANDAGES/DRESSINGS) ×3 IMPLANT
BOOT STEPPER DURA MED (SOFTGOODS) ×2 IMPLANT
CHLORAPREP W/TINT 26 (MISCELLANEOUS) ×3 IMPLANT
CLOSURE STERI-STRIP 1/4X4 (GAUZE/BANDAGES/DRESSINGS) ×3 IMPLANT
CLOSURE WOUND 1/2 X4 (GAUZE/BANDAGES/DRESSINGS) ×1
CLOTH BEACON ORANGE TIMEOUT ST (SAFETY) ×3 IMPLANT
COVER LIGHT HANDLE STERIS (MISCELLANEOUS) ×6 IMPLANT
COVER WAND RF STERILE (DRAPES) ×3 IMPLANT
CUFF TOURN SGL QUICK 18X4 (TOURNIQUET CUFF) ×2 IMPLANT
DECANTER SPIKE VIAL GLASS SM (MISCELLANEOUS) ×6 IMPLANT
DRAPE OEC MINIVIEW 54X84 (DRAPES) ×3 IMPLANT
DRILL MICRO ACUTRAK 2 LNG PROF (BIT) ×3
DRSG ADAPTIC 3X8 NADH LF (GAUZE/BANDAGES/DRESSINGS) ×3 IMPLANT
ELECT REM PT RETURN 9FT ADLT (ELECTROSURGICAL) ×3
ELECTRODE REM PT RTRN 9FT ADLT (ELECTROSURGICAL) ×1 IMPLANT
GAUZE SPONGE 4X4 12PLY STRL (GAUZE/BANDAGES/DRESSINGS) ×3 IMPLANT
GLOVE BIO SURGEON STRL SZ7.5 (GLOVE) ×3 IMPLANT
GLOVE BIOGEL PI IND STRL 7.0 (GLOVE) ×1 IMPLANT
GLOVE BIOGEL PI IND STRL 7.5 (GLOVE) ×2 IMPLANT
GLOVE BIOGEL PI INDICATOR 7.0 (GLOVE) ×2
GLOVE BIOGEL PI INDICATOR 7.5 (GLOVE) ×4
GLOVE ECLIPSE 6.5 STRL STRAW (GLOVE) ×2 IMPLANT
GLOVE ECLIPSE 7.0 STRL STRAW (GLOVE) ×6 IMPLANT
GOWN STRL REUS W/ TWL LRG LVL3 (GOWN DISPOSABLE) ×1 IMPLANT
GOWN STRL REUS W/TWL LRG LVL3 (GOWN DISPOSABLE) ×9 IMPLANT
GUIDEWIRE ORTHO MICROSHT  ACUT (WIRE) ×2
GUIDEWIRE ORTHO MICROSHT .035 (WIRE) IMPLANT
MANIFOLD NEPTUNE II (INSTRUMENTS) ×3 IMPLANT
NDL HYPO 25X1 1.5 SAFETY (NEEDLE) ×5 IMPLANT
NEEDLE HYPO 25X1 1.5 SAFETY (NEEDLE) ×9 IMPLANT
NS IRRIG 1000ML POUR BTL (IV SOLUTION) ×3 IMPLANT
PACK BASIC LIMB (CUSTOM PROCEDURE TRAY) ×3 IMPLANT
PAD ARMBOARD 7.5X6 YLW CONV (MISCELLANEOUS) ×3 IMPLANT
RASP SM TEAR CROSS CUT (RASP) ×2 IMPLANT
SCREW ACUTRAK 2 MICRO 20MM (Screw) ×2 IMPLANT
SET BASIN LINEN APH (SET/KITS/TRAYS/PACK) ×3 IMPLANT
STRIP CLOSURE SKIN 1/2X4 (GAUZE/BANDAGES/DRESSINGS) ×3 IMPLANT
SUT VIC AB 2-0 CT2 27 (SUTURE) ×3 IMPLANT
SUT VIC AB 3-0 SH 27 (SUTURE) ×3
SUT VIC AB 3-0 SH 27X BRD (SUTURE) IMPLANT
SUT VIC AB 4-0 PS2 27 (SUTURE) ×4 IMPLANT
SYR CONTROL 10ML LL (SYRINGE) ×6 IMPLANT

## 2020-02-21 NOTE — Anesthesia Postprocedure Evaluation (Signed)
Anesthesia Post Note  Patient: Sandra Crane  Procedure(s) Performed: DISTAL METATARSAL OSTEOTOMY (Right Toe) LENGTHENING OF EXTENSOR TENDON (Right Toe)  Patient location during evaluation: PACU Anesthesia Type: General Level of consciousness: awake and alert and patient cooperative Pain management: satisfactory to patient Vital Signs Assessment: post-procedure vital signs reviewed and stable Respiratory status: spontaneous breathing Cardiovascular status: stable Postop Assessment: no apparent nausea or vomiting Anesthetic complications: no     Last Vitals:  Vitals:   02/21/20 1245 02/21/20 1310  BP:  113/78  Pulse: (!) 56 63  Resp: 20 18  Temp:    SpO2: 97% 98%    Last Pain:  Vitals:   02/21/20 1310  TempSrc:   PainSc: 0-No pain                 Fleur Audino

## 2020-02-21 NOTE — Anesthesia Preprocedure Evaluation (Signed)
Anesthesia Evaluation  Patient identified by MRN, date of birth, ID band Patient awake    Reviewed: Allergy & Precautions, NPO status , Patient's Chart, lab work & pertinent test results  History of Anesthesia Complications Negative for: history of anesthetic complications  Airway Mallampati: II  TM Distance: >3 FB Neck ROM: Full    Dental  (+) Teeth Intact, Dental Advisory Given, Caps   Pulmonary neg pulmonary ROS,    Pulmonary exam normal breath sounds clear to auscultation       Cardiovascular Exercise Tolerance: Good negative cardio ROS Normal cardiovascular exam Rhythm:Regular Rate:Normal     Neuro/Psych negative neurological ROS  negative psych ROS   GI/Hepatic Neg liver ROS, PUD, Abnormal LFTs Ulcerative colitis   Endo/Other  negative endocrine ROS  Renal/GU negative Renal ROS  negative genitourinary   Musculoskeletal negative musculoskeletal ROS (+)   Abdominal   Peds negative pediatric ROS (+)  Hematology negative hematology ROS (+)   Anesthesia Other Findings   Reproductive/Obstetrics negative OB ROS                             Anesthesia Physical  Anesthesia Plan  ASA: II  Anesthesia Plan: General   Post-op Pain Management:    Induction: Intravenous  PONV Risk Score and Plan: 2 and Propofol infusion  Airway Management Planned: Nasal Cannula, Natural Airway and Simple Face Mask  Additional Equipment:   Intra-op Plan:   Post-operative Plan: Extubation in OR  Informed Consent: I have reviewed the patients History and Physical, chart, labs and discussed the procedure including the risks, benefits and alternatives for the proposed anesthesia with the patient or authorized representative who has indicated his/her understanding and acceptance.     Dental advisory given  Plan Discussed with: CRNA and Surgeon  Anesthesia Plan Comments:          Anesthesia Quick Evaluation

## 2020-02-21 NOTE — Discharge Instructions (Signed)
.These instructions will give you an idea of what to expect after surgery and how to manage issues that may arise before your first post op office visit.  Pain Management Pain is best managed by "staying ahead" of it. If pain gets out of control, it is difficult to get it back under control. Local anesthesia that lasts 6-8 hours is used to numb the foot and decrease pain.  For the best pain control, take the pain medication every 4 hours for the first 2 days post op. On the third day pain medication can be taken as needed.   Post Op Nausea Nausea is common after surgery, so it is managed proactively.  If prescribed, use the prescribed nausea medication regularly for the first 2 days post op.  Bandages Do not worry if there is blood on the bandage. What looks like a lot of blood on the bandage is actually a small amount. Blood on the dressing spreads out as it is absorbed by the gauze, the same way a drop of water spreads out on a paper towel.  If the bandages feel wet or dry, stiff and uncomfortable, call the office during office hours and we will schedule a time for you to have the bandage changed.  Unless you are specifically told otherwise, we will do the first bandage change in the office.  Keep your bandage dry. If the bandage becomes wet or soiled, notify the office and we will schedule a time to change the bandage.  Activity It is best to spend most of the first 2 days after surgery lying down with the foot elevated above the level of your heart. You may put weight on your heel while wearing the surgical shoe.   You may only get up to go to the restroom.  Driving Do not drive until you are able to respond in an emergency (i.e. slam on the brakes). This usually occurs after the bone has healed - 6 to 8 weeks.  Call the Office If you have a fever over 101F.  If you have increasing pain after the initial post op pain has settled down.  If you have increasing redness, swelling, or  drainage.  If you have any questions or concerns.   General Anesthesia, Adult, Care After This sheet gives you information about how to care for yourself after your procedure. Your health care provider may also give you more specific instructions. If you have problems or questions, contact your health care provider. What can I expect after the procedure? After the procedure, the following side effects are common:  Pain or discomfort at the IV site.  Nausea.  Vomiting.  Sore throat.  Trouble concentrating.  Feeling cold or chills.  Weak or tired.  Sleepiness and fatigue.  Soreness and body aches. These side effects can affect parts of the body that were not involved in surgery. Follow these instructions at home:  For at least 24 hours after the procedure:  Have a responsible adult stay with you. It is important to have someone help care for you until you are awake and alert.  Rest as needed.  Do not: ? Participate in activities in which you could fall or become injured. ? Drive. ? Use heavy machinery. ? Drink alcohol. ? Take sleeping pills or medicines that cause drowsiness. ? Make important decisions or sign legal documents. ? Take care of children on your own. Eating and drinking  Follow any instructions from your health care provider about eating or  drinking restrictions.  When you feel hungry, start by eating small amounts of foods that are soft and easy to digest (bland), such as toast. Gradually return to your regular diet.  Drink enough fluid to keep your urine pale yellow.  If you vomit, rehydrate by drinking water, juice, or clear broth. General instructions  If you have sleep apnea, surgery and certain medicines can increase your risk for breathing problems. Follow instructions from your health care provider about wearing your sleep device: ? Anytime you are sleeping, including during daytime naps. ? While taking prescription pain medicines, sleeping  medicines, or medicines that make you drowsy.  Return to your normal activities as told by your health care provider. Ask your health care provider what activities are safe for you.  Take over-the-counter and prescription medicines only as told by your health care provider.  If you smoke, do not smoke without supervision.  Keep all follow-up visits as told by your health care provider. This is important. Contact a health care provider if:  You have nausea or vomiting that does not get better with medicine.  You cannot eat or drink without vomiting.  You have pain that does not get better with medicine.  You are unable to pass urine.  You develop a skin rash.  You have a fever.  You have redness around your IV site that gets worse. Get help right away if:  You have difficulty breathing.  You have chest pain.  You have blood in your urine or stool, or you vomit blood. Summary  After the procedure, it is common to have a sore throat or nausea. It is also common to feel tired.  Have a responsible adult stay with you for the first 24 hours after general anesthesia. It is important to have someone help care for you until you are awake and alert.  When you feel hungry, start by eating small amounts of foods that are soft and easy to digest (bland), such as toast. Gradually return to your regular diet.  Drink enough fluid to keep your urine pale yellow.  Return to your normal activities as told by your health care provider. Ask your health care provider what activities are safe for you. This information is not intended to replace advice given to you by your health care provider. Make sure you discuss any questions you have with your health care provider. Document Revised: 10/29/2017 Document Reviewed: 06/11/2017 Elsevier Patient Education  [place].

## 2020-02-21 NOTE — H&P (Signed)
.   HISTORY AND PHYSICAL INTERVAL NOTE:  02/21/2020  10:36 AM  Sandra Crane  has presented today for surgery, with the diagnosis of RIGHT FOOT HALLUX ADUCTOR VALGUS AND TOE PAIN.  The various methods of treatment have been discussed with the patient.  No guarantees were given.  After consideration of risks, benefits and other options for treatment, the patient has consented to surgery.  I have reviewed the patients' chart and labs.    Patient Vitals for the past 24 hrs:  BP Temp Temp src Pulse Resp SpO2  02/21/20 0958 117/82 98.8 F (37.1 C) Oral 74 18 100 %    A history and physical examination was performed in my office.  The patient was reexamined.  There have been no changes to this history and physical examination.  Tyson Babinski, DPM

## 2020-02-21 NOTE — Transfer of Care (Signed)
Immediate Anesthesia Transfer of Care Note  Patient: Sandra Crane  Procedure(s) Performed: DISTAL METATARSAL OSTEOTOMY (Right Toe) LENGTHENING OF EXTENSOR TENDON (Right Toe)  Patient Location: PACU  Anesthesia Type:General  Level of Consciousness: awake, alert  and patient cooperative  Airway & Oxygen Therapy: Patient Spontanous Breathing  Post-op Assessment: Report given to RN and Post -op Vital signs reviewed and stable  Post vital signs: Reviewed and stable  Last Vitals:  Vitals Value Taken Time  BP    Temp    Pulse 69 02/21/20 1227  Resp 21 02/21/20 1227  SpO2 97 % 02/21/20 1227  Vitals shown include unvalidated device data.  Last Pain:  Vitals:   02/21/20 0958  TempSrc: Oral  PainSc: 0-No pain      Patients Stated Pain Goal: 6 (67/28/97 9150)  Complications: No apparent anesthesia complications

## 2020-02-21 NOTE — Op Note (Signed)
PATIENT:  Sandra Crane  54 y.o. female  PRE-OPERATIVE DIAGNOSIS:  RIGHT FOOT HALLUX ADUCTOR VALGUS AND TOE PAIN  POST-OPERATIVE DIAGNOSIS:  RIGHT FOOT HALLUX ADUCTOR VALGUS AND TOE PAIN  PROCEDURE:  Distal metatarsal osteotomy right foot. Extensor tendon lengthening right foot.   SURGEON:  Surgeon(s) and Role:    * Posey Pronto, [place], DPM - Primary     ASSISTANTS: Caprice Beaver, DPM   ANESTHESIA:   local and MAC  EBL:  0 mL   Materials: 20 mm accumed headless screw.   LOCAL MEDICATIONS USED:  MARCAINE   , LIDOCAINE  and Amount: 16 ml pre- op. Post op 10cc of 0.5% marcaine plain.   SPECIMEN:  No Specimen  TOURNIQUET:   Total Tourniquet Time Documented: Calf (Right) - 49 minutes Total: Calf (Right) - 49 minutes  Patient was brought into the operating room laid supine on the operating table. Ankle tourniquet was applied to the surgical extremity. Following IV sedation, a local block was achieved using 16cc of mixture of 1% plain lidocaine with 0.5% marcaine. The right foot was the prepped, scrubbed and draped in aseptic manner. Using an esmarch band the tourniquet on the surgical site was inflatted at 291mHG.   .Marland Kitchenttention was then directed to the dorsomedial aspect of the first metatarsophalangeal joint where a 6-cm linear incision was made medial and parallel to the course of the extensor hallucis longus tendon.  The incision was deepened through subcutaneous tissues.  Vital neurovascular structures were identified and retracted.  All bleeders were identified and cauterized.  The incision was deepened to the level of the first metatarsophalangeal joint capsule.  An inverted L-type capsulotomy was performed over the dorsal aspect of the first metatarsophalangeal joint.  The capsular and periosteal structures were dissected free of their osseous attachments and reflected medially and laterally thus exposing the head of the first metatarsal at the operative site.   Utilizing a sagittal saw, the medial prominence was resected and passed from the operative field.  All rough edges were smoothed.    Attention was directed to the first interspace via the original skin incision.  Using sharp and blunt dissection, the fibular sesamoid was freed of its soft tissue attachments proximally, laterally and distally.  The conjoined tendon of the adductor hallucis muscle was identified and transected at its attachment to the base of the proximal phalanx of the hallux.  The lateral contracture present on the hallux was noted to be reduced and the sesamoid apparatus was noted to float into a more corrected medial position.    Attention was redirected to the medial aspect of the first metatarsal head where a through and through distal first metatarsal osteotomy was created in the metaphyseal region of the first metatarsal bone using a sagittal bone saw. There was cyst noted at the medial aspect of the head.   The apex of the osteotomy pointed distally.  Upon completion of the osteotomy the capital fragment was distracted and shifted laterally into a more corrected position.  A k-wire from the Acumed cannulated screw set was inserted across the osteotomy site from a dorsal proximal to plantar distal direction.  An Acumed cannulated screw was inserted across the k-wire.  Adequate compression was noted.  The position of the screw was confirmed with fluoroscopy and noted to be in acceptable alignment.    The remaining medial bone shelf was resected utilizing a sagittal bone saw and passed from the operative field.  The area was copiously irrigated. It was noted  at this time that the extensor tendon was taught and the toe was sitting up. Decision was made to lengthen the tendon to reduce the contracture. Z shape lengethening of the extensor hallucis longus tendon was performed. The tendon was repaired using 3-0 Vicryl.  The periosteal and capsular tissues were approximated with 2-0 Vicryl  suture.  4-0 Vicryl was used to approximate the subcutaneous tissues. 4-0 Vicryl was used to approximate the skin in a subcuticular manner.

## 2020-02-21 NOTE — Brief Op Note (Signed)
02/21/2020  12:22 PM  PATIENT:  Sandra Crane  54 y.o. female  PRE-OPERATIVE DIAGNOSIS:  RIGHT FOOT HALLUX ADUCTOR VALGUS AND TOE PAIN  POST-OPERATIVE DIAGNOSIS:  RIGHT FOOT HALLUX ADUCTOR VALGUS AND TOE PAIN  PROCEDURE:  Distal metatarsal osteotomy right foot. Extensor tendon lengthening right foot.   SURGEON:  Surgeon(s) and Role:    * Posey Pronto, [place], DPM - Primary       ASSISTANTS: Caprice Beaver, DPM   ANESTHESIA:   local and MAC  EBL:  0 mL   BLOOD ADMINISTERED:none  DRAINS: none   LOCAL MEDICATIONS USED:  MARCAINE   , LIDOCAINE  and Amount: 16 ml  SPECIMEN:  No Specimen  DISPOSITION OF SPECIMEN:  N/A  COUNTS:  YES  TOURNIQUET:   Total Tourniquet Time Documented: Calf (Right) - 49 minutes Total: Calf (Right) - 49 minutes   DICTATION: .Viviann Spare Dictation  PLAN OF CARE: Discharge to home after PACU  PATIENT DISPOSITION:  PACU - hemodynamically stable.   Delay start of Pharmacological VTE agent (>24hrs) due to surgical blood loss or risk of bleeding: not applicable

## 2020-02-21 NOTE — Anesthesia Procedure Notes (Signed)
Date/Time: 02/21/2020 11:11 AM Performed by: Vista Deck, CRNA Pre-anesthesia Checklist: Patient identified, Emergency Drugs available, Suction available, Timeout performed and Patient being monitored Patient Re-evaluated:Patient Re-evaluated prior to induction Oxygen Delivery Method: Nasal Cannula

## 2020-02-23 ENCOUNTER — Ambulatory Visit: Payer: BC Managed Care – PPO | Admitting: Obstetrics & Gynecology

## 2020-02-23 ENCOUNTER — Other Ambulatory Visit: Payer: BC Managed Care – PPO

## 2020-03-27 ENCOUNTER — Other Ambulatory Visit (INDEPENDENT_AMBULATORY_CARE_PROVIDER_SITE_OTHER): Payer: Self-pay | Admitting: *Deleted

## 2020-03-27 DIAGNOSIS — K5190 Ulcerative colitis, unspecified, without complications: Secondary | ICD-10-CM

## 2020-03-27 DIAGNOSIS — E7800 Pure hypercholesterolemia, unspecified: Secondary | ICD-10-CM

## 2020-03-27 DIAGNOSIS — R7989 Other specified abnormal findings of blood chemistry: Secondary | ICD-10-CM

## 2020-04-11 ENCOUNTER — Other Ambulatory Visit (INDEPENDENT_AMBULATORY_CARE_PROVIDER_SITE_OTHER): Payer: Self-pay | Admitting: Gastroenterology

## 2020-04-11 DIAGNOSIS — K5120 Ulcerative (chronic) proctitis without complications: Secondary | ICD-10-CM

## 2020-04-25 ENCOUNTER — Other Ambulatory Visit (INDEPENDENT_AMBULATORY_CARE_PROVIDER_SITE_OTHER): Payer: Self-pay | Admitting: Internal Medicine

## 2020-04-25 DIAGNOSIS — D702 Other drug-induced agranulocytosis: Secondary | ICD-10-CM

## 2020-04-25 LAB — CBC WITH DIFFERENTIAL/PLATELET
Absolute Monocytes: 484 cells/uL (ref 200–950)
Basophils Absolute: 20 cells/uL (ref 0–200)
Basophils Relative: 0.7 %
Eosinophils Absolute: 11 cells/uL — ABNORMAL LOW (ref 15–500)
Eosinophils Relative: 0.4 %
HCT: 38.1 % (ref 35.0–45.0)
Hemoglobin: 12.7 g/dL (ref 11.7–15.5)
Lymphs Abs: 465 cells/uL — ABNORMAL LOW (ref 850–3900)
MCH: 33.2 pg — ABNORMAL HIGH (ref 27.0–33.0)
MCHC: 33.3 g/dL (ref 32.0–36.0)
MCV: 99.5 fL (ref 80.0–100.0)
MPV: 9.5 fL (ref 7.5–12.5)
Monocytes Relative: 17.3 %
Neutro Abs: 1820 cells/uL (ref 1500–7800)
Neutrophils Relative %: 65 %
Platelets: 251 10*3/uL (ref 140–400)
RBC: 3.83 10*6/uL (ref 3.80–5.10)
RDW: 12.8 % (ref 11.0–15.0)
Total Lymphocyte: 16.6 %
WBC: 2.8 10*3/uL — ABNORMAL LOW (ref 3.8–10.8)

## 2020-04-25 LAB — COMPREHENSIVE METABOLIC PANEL
AG Ratio: 2 (calc) (ref 1.0–2.5)
ALT: 19 U/L (ref 6–29)
AST: 17 U/L (ref 10–35)
Albumin: 4.4 g/dL (ref 3.6–5.1)
Alkaline phosphatase (APISO): 54 U/L (ref 37–153)
BUN: 15 mg/dL (ref 7–25)
CO2: 28 mmol/L (ref 20–32)
Calcium: 9.3 mg/dL (ref 8.6–10.4)
Chloride: 105 mmol/L (ref 98–110)
Creat: 0.68 mg/dL (ref 0.50–1.05)
Globulin: 2.2 g/dL (calc) (ref 1.9–3.7)
Glucose, Bld: 89 mg/dL (ref 65–139)
Potassium: 3.8 mmol/L (ref 3.5–5.3)
Sodium: 141 mmol/L (ref 135–146)
Total Bilirubin: 0.8 mg/dL (ref 0.2–1.2)
Total Protein: 6.6 g/dL (ref 6.1–8.1)

## 2020-04-29 ENCOUNTER — Telehealth (INDEPENDENT_AMBULATORY_CARE_PROVIDER_SITE_OTHER): Payer: Self-pay | Admitting: Internal Medicine

## 2020-04-29 ENCOUNTER — Other Ambulatory Visit: Payer: Self-pay | Admitting: Adult Health

## 2020-04-29 ENCOUNTER — Other Ambulatory Visit (INDEPENDENT_AMBULATORY_CARE_PROVIDER_SITE_OTHER): Payer: Self-pay | Admitting: *Deleted

## 2020-04-29 DIAGNOSIS — D729 Disorder of white blood cells, unspecified: Secondary | ICD-10-CM

## 2020-04-29 DIAGNOSIS — K5190 Ulcerative colitis, unspecified, without complications: Secondary | ICD-10-CM

## 2020-04-29 DIAGNOSIS — Z1231 Encounter for screening mammogram for malignant neoplasm of breast: Secondary | ICD-10-CM

## 2020-04-29 NOTE — Telephone Encounter (Signed)
Please call the patient and let her know that an order is ready for her to go to [facility] to have labs done.

## 2020-04-29 NOTE — Telephone Encounter (Signed)
Patient left voice mail message stating she was to have some repeat labs but hasn't heard anything - please advise - [phone]

## 2020-05-02 LAB — CBC WITH DIFFERENTIAL/PLATELET
Absolute Monocytes: 494 cells/uL (ref 200–950)
Basophils Absolute: 21 cells/uL (ref 0–200)
Basophils Relative: 0.6 %
Eosinophils Absolute: 21 cells/uL (ref 15–500)
Eosinophils Relative: 0.6 %
HCT: 38.7 % (ref 35.0–45.0)
Hemoglobin: 12.8 g/dL (ref 11.7–15.5)
Lymphs Abs: 473 cells/uL — ABNORMAL LOW (ref 850–3900)
MCH: 33.9 pg — ABNORMAL HIGH (ref 27.0–33.0)
MCHC: 33.1 g/dL (ref 32.0–36.0)
MCV: 102.4 fL — ABNORMAL HIGH (ref 80.0–100.0)
MPV: 9.9 fL (ref 7.5–12.5)
Monocytes Relative: 14.1 %
Neutro Abs: 2492 cells/uL (ref 1500–7800)
Neutrophils Relative %: 71.2 %
Platelets: 288 10*3/uL (ref 140–400)
RBC: 3.78 10*6/uL — ABNORMAL LOW (ref 3.80–5.10)
RDW: 12.8 % (ref 11.0–15.0)
Total Lymphocyte: 13.5 %
WBC: 3.5 10*3/uL — ABNORMAL LOW (ref 3.8–10.8)

## 2020-05-08 ENCOUNTER — Telehealth (INDEPENDENT_AMBULATORY_CARE_PROVIDER_SITE_OTHER): Payer: Self-pay | Admitting: *Deleted

## 2020-05-08 NOTE — Telephone Encounter (Signed)
Sandra Crane - have you seen this ?   Results reviewed with patient. Remains with leukopenia although ANC is normal. Lost colonoscopy was in December 2016 as she was in remission. Patient advised to decrease 6-MP dose to 37.5 mg. She has a ParaGard when she was able to divide the pill f and take three fourths of pill every day. She developed blood work following her office visit in July. We will plan surveillance colonoscopy following that visit. If she remains in endoscopic remission I am inclined to stop 6-MP and see how she does on mesalamine.  Just FYI.

## 2020-06-06 ENCOUNTER — Telehealth (INDEPENDENT_AMBULATORY_CARE_PROVIDER_SITE_OTHER): Payer: Self-pay | Admitting: *Deleted

## 2020-06-06 ENCOUNTER — Other Ambulatory Visit (HOSPITAL_COMMUNITY): Payer: Self-pay | Admitting: Physician Assistant

## 2020-06-06 ENCOUNTER — Ambulatory Visit (HOSPITAL_COMMUNITY)
Admission: RE | Admit: 2020-06-06 | Discharge: 2020-06-06 | Disposition: A | Discharge status: Home / Self Care | Payer: BC Managed Care – PPO | Source: Ambulatory Visit | Attending: Physician Assistant | Admitting: Physician Assistant

## 2020-06-06 ENCOUNTER — Other Ambulatory Visit: Payer: Self-pay

## 2020-06-06 ENCOUNTER — Encounter (INDEPENDENT_AMBULATORY_CARE_PROVIDER_SITE_OTHER): Payer: Self-pay | Admitting: Gastroenterology

## 2020-06-06 ENCOUNTER — Ambulatory Visit (INDEPENDENT_AMBULATORY_CARE_PROVIDER_SITE_OTHER): Payer: BC Managed Care – PPO | Admitting: Gastroenterology

## 2020-06-06 ENCOUNTER — Encounter (INDEPENDENT_AMBULATORY_CARE_PROVIDER_SITE_OTHER): Payer: Self-pay | Admitting: *Deleted

## 2020-06-06 VITALS — BP 109/72 | HR 62 | Temp 99.2°F | Ht 63.0 in | Wt 130.9 lb

## 2020-06-06 DIAGNOSIS — K5190 Ulcerative colitis, unspecified, without complications: Secondary | ICD-10-CM

## 2020-06-06 DIAGNOSIS — M25552 Pain in left hip: Secondary | ICD-10-CM

## 2020-06-06 DIAGNOSIS — D702 Other drug-induced agranulocytosis: Secondary | ICD-10-CM | POA: Diagnosis not present

## 2020-06-06 NOTE — Telephone Encounter (Signed)
Patient needs Sutab (copay card)

## 2020-06-06 NOTE — Patient Instructions (Signed)
We are scheduling colonoscopy for evaluation picking labs today.  I will call with results

## 2020-06-06 NOTE — Progress Notes (Signed)
Patient profile: Sandra Crane is a 54 y.o. female seen for follow up.  Past medical history of ulcerative colitis dx 2006 maintained on 6-MP since 2011 (prior on mesalamine and remicade)  She was last seen Nov 2020 with a slightly subtherapeutic level without signs of hepatotoxicity.  Also maintained on Lialda 2.4 gram/day.   History of Present Illness: Sandra Crane is seen today for follow up. She states she is doing well having 1-2 BM/day on average. She denies abdominal pain or blood in stool. Feels GI symptoms are well controlled.   Patient denies nausea, vomiting, GERD, dysphagia, epigastric pain and weight loss. Reports a good appetite. Currently doing weight watchers for past 5 months. She had foot surgery since her last visit w/ [place].   Non smoker. No alcohol. Denies frequent nsaid use.   Wt Readings from Last 3 Encounters:  06/06/20 130 lb 14.4 oz (59.4 kg)  02/19/20 138 lb (62.6 kg)  02/19/20 138 lb (62.6 kg)     Last Colonoscopy: Colonoscopy 10/24/2015: Examination performed to cecum. Ulcerative colitis is in remission( complete mucosal healing). A 6 mm polyp hot snared from ascending colon. Small internal hemorrhoids. 5 year repeat recommended      Past Medical History:  Past Medical History:  Diagnosis Date  . Contraceptive management 09/12/2013  . Elevated cholesterol   . Ulcerative colitis     Problem List: Patient Active Problem List   Diagnosis Date Noted  . PMB (postmenopausal bleeding) 11/14/2019  . Encounter for well woman exam with routine gynecological exam 11/14/2019  . Encounter for gynecological examination with Papanicolaou smear of cervix 10/03/2018  . Screening for colorectal cancer 10/03/2018  . Well woman exam with routine gynecological exam 09/29/2017  . Encounter for surveillance of injectable contraceptive 09/29/2017  . Dry cough 09/29/2017  . Menopausal symptoms 09/29/2017  . Abnormal LFTs 08/28/2014  . Contraceptive  management 09/12/2013  . Ulcerative colitis ([place]) 07/04/2012  . Hyperlipidemia 07/04/2012    Past Surgical History: Past Surgical History:  Procedure Laterality Date  . CESAREAN SECTION  [phone]  . COLONOSCOPY    . COLONOSCOPY N/A 10/24/2015   Procedure: COLONOSCOPY;  Surgeon: Rogene Houston, MD;  Location: [facility];  Service: Endoscopy;  Laterality: N/A;  12:00  . FOOT SURGERY Right 02/21/2020  . METATARSAL OSTEOTOMY Right 02/21/2020   Procedure: DISTAL METATARSAL OSTEOTOMY;  Surgeon: Tyson Babinski, DPM;  Location: [facility];  Service: Podiatry;  Laterality: Right;  . REPAIR EXTENSOR TENDON Right 02/21/2020   Procedure: LENGTHENING OF EXTENSOR TENDON;  Surgeon: Tyson Babinski, DPM;  Location: [facility];  Service: Podiatry;  Laterality: Right;  . root canal with crown    . SIGMOIDOSCOPY  07/19/2009  . TONSILLECTOMY AND ADENOIDECTOMY N/A 11/10/2016   Procedure: TONSILLECTOMY AND ADENOIDECTOMY;  Surgeon: Leta Baptist, MD;  Location: [facility];  Service: ENT;  Laterality: N/A;  . WISDOM TOOTH EXTRACTION      Allergies: No Known Allergies    Home Medications:  Current Outpatient Medications:  .  Boswellia-Glucosamine-Vit D (OSTEO BI-FLEX ONE PER DAY PO), Take 1 tablet by mouth daily. , Disp: , Rfl:  .  mesalamine (LIALDA) 1.2 g EC tablet, TAKE 2 TABLETS BY MOUTH TWICE DAILY (Patient taking differently: Take 2.4 g by mouth in the morning and at bedtime. ), Disp: 120 tablet, Rfl: 5 .  Multiple Vitamin (MULTIVITAMIN WITH MINERALS) TABS tablet, Take 1 tablet by mouth daily. With Iron, Disp: , Rfl:  .  progesterone (PROMETRIUM) 200  MG capsule, Take 1 capsule (200 mg total) by mouth daily., Disp: 90 capsule, Rfl: 3 .  simvastatin (ZOCOR) 40 MG tablet, TAKE 1 TABLET(40 MG) BY MOUTH EVERY EVENING (Patient taking differently: Take 40 mg by mouth every evening. ), Disp: 30 tablet, Rfl: 5 .  mercaptopurine (PURINETHOL) 50 MG tablet, TAKE 1 TABLET BY MOUTH DAILY ON EMPTY  STOMACH 1 HOUR BEFORE MEALS OR 2 HOURS AFTER MEALS, Disp: 30 tablet, Rfl: 3 .  Sodium Sulfate-Mag Sulfate-KCl (SUTAB) [phone] MG TABS, Take 1 kit by mouth once for 1 dose., Disp: 24 tablet, Rfl: 0   Family History: family history includes Cancer in her mother; Diabetes in her father, mother, and sister; Glaucoma in her brother; Healthy in her brother, brother, daughter, daughter, sister, and son; Hyperlipidemia in her sister; Hypertension in her sister; Thyroid cancer in her mother.    Social History:   reports that she has never smoked. She has never used smokeless tobacco. She reports current alcohol use. She reports that she does not use drugs.   Review of Systems: Constitutional: Denies weight loss/weight gain  Eyes: No changes in vision. ENT: No oral lesions, sore throat.  GI: see HPI.  Heme/Lymph: No easy bruising.  CV: No chest pain.  GU: No hematuria.  Integumentary: No rashes.  Neuro: No headaches.  Psych: No depression/anxiety.  Endocrine: No heat/cold intolerance.  Allergic/Immunologic: No urticaria.  Resp: No cough, SOB.  Musculoskeletal: No joint swelling.    Physical Examination: BP 109/72 (BP Location: Right Arm, Patient Position: Sitting, Cuff Size: Normal)   Pulse 62   Temp 99.2 F (37.3 C) (Oral)   Ht _0  (1.6 m)   Wt 130 lb 14.4 oz (59.4 kg)   BMI 23.19 kg/m  Gen: NAD, alert and oriented x 4 HEENT: PEERLA, EOMI, Neck: supple, no JVD Chest: CTA bilaterally, no wheezes, crackles, or other adventitious sounds CV: RRR, no m/g/c/r Abd: soft, NT, ND, +BS in all four quadrants; no HSM, guarding, ridigity, or rebound tenderness Ext: no edema, well perfused with 2+ pulses, Skin: no rash or lesions noted on observed skin Lymph: no noted LAD  Data Reviewed:   09/2019-thiopurine metabolites 3746, TTG 224  04/25/20-WBC 2.8, lymphs 465  05/02/20-WBC 3.5, lymphs 473  Assessment/Plan: Ms. Dolecki is a 54 y.o. female for f/up of UC.   1. UC-maintained on  6-MP and lialda, she had mild leukopenia at 50 mg/day dose with subtherapuetic drug level, after last labs her dose was decreased to 37.5 mg a day 4 weeks ago, will repeat her labs today. She is tentatively due for colonoscopy 2021 - we will arrange this today. If endoscopically in remission will consider stopping 6-MP. She clinically feels well.   Zyan was seen today for follow-up.  Diagnoses and all orders for this visit:  Ulcerative colitis without complications, unspecified location ([place]) -     CBC with Differential -     COMPLETE METABOLIC PANEL WITH GFR  Other drug-induced neutropenia ([facility]) -     CBC with Differential -     COMPLETE METABOLIC PANEL WITH GFR     Patient denies CP, SOB, and use of blood thinners. I discussed the risks and benefits of procedure including bleeding, perforation, infection, missed lesions, medication reactions and possible hospitalization or surgery if complications. All questions answered.   I personally performed the service, non-incident to. (WP)  Laurine Blazer, [facility]

## 2020-06-07 ENCOUNTER — Ambulatory Visit
Admission: RE | Admit: 2020-06-07 | Discharge: 2020-06-07 | Disposition: A | Discharge status: Home / Self Care | Payer: BC Managed Care – PPO | Source: Ambulatory Visit | Attending: Adult Health | Admitting: Adult Health

## 2020-06-07 DIAGNOSIS — Z1231 Encounter for screening mammogram for malignant neoplasm of breast: Secondary | ICD-10-CM

## 2020-06-07 LAB — CBC WITH DIFFERENTIAL/PLATELET
Absolute Monocytes: 713 cells/uL (ref 200–950)
Basophils Absolute: 32 cells/uL (ref 0–200)
Basophils Relative: 0.7 %
Eosinophils Absolute: 51 cells/uL (ref 15–500)
Eosinophils Relative: 1.1 %
HCT: 41 % (ref 35.0–45.0)
Hemoglobin: 14 g/dL (ref 11.7–15.5)
Lymphs Abs: 672 cells/uL — ABNORMAL LOW (ref 850–3900)
MCH: 34.2 pg — ABNORMAL HIGH (ref 27.0–33.0)
MCHC: 34.1 g/dL (ref 32.0–36.0)
MCV: 100.2 fL — ABNORMAL HIGH (ref 80.0–100.0)
MPV: 9.7 fL (ref 7.5–12.5)
Monocytes Relative: 15.5 %
Neutro Abs: 3133 cells/uL (ref 1500–7800)
Neutrophils Relative %: 68.1 %
Platelets: 227 10*3/uL (ref 140–400)
RBC: 4.09 10*6/uL (ref 3.80–5.10)
RDW: 12.6 % (ref 11.0–15.0)
Total Lymphocyte: 14.6 %
WBC: 4.6 10*3/uL (ref 3.8–10.8)

## 2020-06-07 LAB — COMPLETE METABOLIC PANEL WITH GFR
AG Ratio: 1.7 (calc) (ref 1.0–2.5)
ALT: 29 U/L (ref 6–29)
AST: 21 U/L (ref 10–35)
Albumin: 4.5 g/dL (ref 3.6–5.1)
Alkaline phosphatase (APISO): 64 U/L (ref 37–153)
BUN: 17 mg/dL (ref 7–25)
CO2: 27 mmol/L (ref 20–32)
Calcium: 9.5 mg/dL (ref 8.6–10.4)
Chloride: 105 mmol/L (ref 98–110)
Creat: 0.82 mg/dL (ref 0.50–1.05)
GFR, Est African American: 94 mL/min/{1.73_m2} (ref >=60)
GFR, Est Non African American: 81 mL/min/{1.73_m2} (ref >=60)
Globulin: 2.6 g/dL (calc) (ref 1.9–3.7)
Glucose, Bld: 86 mg/dL (ref 65–139)
Potassium: 4 mmol/L (ref 3.5–5.3)
Sodium: 140 mmol/L (ref 135–146)
Total Bilirubin: 0.9 mg/dL (ref 0.2–1.2)
Total Protein: 7.1 g/dL (ref 6.1–8.1)

## 2020-06-07 MED ORDER — SUTAB 1479-225-188 MG PO TABS
1.0000 | ORAL_TABLET | Freq: Once | ORAL | 0 refills | Status: AC
Start: 2020-06-07 — End: 2020-06-07

## 2020-07-20 ENCOUNTER — Other Ambulatory Visit (INDEPENDENT_AMBULATORY_CARE_PROVIDER_SITE_OTHER): Payer: Self-pay | Admitting: Internal Medicine

## 2020-07-24 ENCOUNTER — Other Ambulatory Visit (INDEPENDENT_AMBULATORY_CARE_PROVIDER_SITE_OTHER): Payer: Self-pay | Admitting: *Deleted

## 2020-08-06 ENCOUNTER — Other Ambulatory Visit (HOSPITAL_COMMUNITY)
Admission: RE | Admit: 2020-08-06 | Discharge: 2020-08-06 | Disposition: A | Discharge status: Home / Self Care | Payer: BC Managed Care – PPO | Source: Ambulatory Visit | Attending: Internal Medicine | Admitting: Internal Medicine

## 2020-08-06 ENCOUNTER — Other Ambulatory Visit: Payer: Self-pay

## 2020-08-06 DIAGNOSIS — Z20822 Contact with and (suspected) exposure to covid-19: Secondary | ICD-10-CM | POA: Insufficient documentation

## 2020-08-06 DIAGNOSIS — Z01812 Encounter for preprocedural laboratory examination: Secondary | ICD-10-CM | POA: Diagnosis not present

## 2020-08-07 LAB — SARS CORONAVIRUS 2 (TAT 6-24 HRS): SARS Coronavirus 2: NEGATIVE

## 2020-08-08 ENCOUNTER — Other Ambulatory Visit: Payer: Self-pay

## 2020-08-08 ENCOUNTER — Encounter (HOSPITAL_COMMUNITY)
Admission: RE | Disposition: A | Discharge status: Home / Self Care | Payer: Self-pay | Source: Home / Self Care | Attending: Internal Medicine

## 2020-08-08 ENCOUNTER — Encounter (HOSPITAL_COMMUNITY): Payer: Self-pay | Admitting: Internal Medicine

## 2020-08-08 ENCOUNTER — Ambulatory Visit (HOSPITAL_COMMUNITY)
Admission: RE | Admit: 2020-08-08 | Discharge: 2020-08-08 | Disposition: A | Discharge status: Home / Self Care | Payer: BC Managed Care – PPO | Attending: Internal Medicine | Admitting: Internal Medicine

## 2020-08-08 DIAGNOSIS — E7800 Pure hypercholesterolemia, unspecified: Secondary | ICD-10-CM | POA: Insufficient documentation

## 2020-08-08 DIAGNOSIS — Z8349 Family history of other endocrine, nutritional and metabolic diseases: Secondary | ICD-10-CM | POA: Diagnosis not present

## 2020-08-08 DIAGNOSIS — Z820 Family history of epilepsy and other diseases of the nervous system: Secondary | ICD-10-CM | POA: Diagnosis not present

## 2020-08-08 DIAGNOSIS — Z1211 Encounter for screening for malignant neoplasm of colon: Secondary | ICD-10-CM | POA: Insufficient documentation

## 2020-08-08 DIAGNOSIS — Z09 Encounter for follow-up examination after completed treatment for conditions other than malignant neoplasm: Secondary | ICD-10-CM

## 2020-08-08 DIAGNOSIS — K644 Residual hemorrhoidal skin tags: Secondary | ICD-10-CM | POA: Insufficient documentation

## 2020-08-08 DIAGNOSIS — K5100 Ulcerative (chronic) pancolitis without complications: Secondary | ICD-10-CM | POA: Diagnosis not present

## 2020-08-08 DIAGNOSIS — Z809 Family history of malignant neoplasm, unspecified: Secondary | ICD-10-CM | POA: Diagnosis not present

## 2020-08-08 DIAGNOSIS — K5120 Ulcerative (chronic) proctitis without complications: Secondary | ICD-10-CM

## 2020-08-08 DIAGNOSIS — Z808 Family history of malignant neoplasm of other organs or systems: Secondary | ICD-10-CM | POA: Insufficient documentation

## 2020-08-08 DIAGNOSIS — Z8249 Family history of ischemic heart disease and other diseases of the circulatory system: Secondary | ICD-10-CM | POA: Diagnosis not present

## 2020-08-08 DIAGNOSIS — Z79899 Other long term (current) drug therapy: Secondary | ICD-10-CM | POA: Diagnosis not present

## 2020-08-08 DIAGNOSIS — Z833 Family history of diabetes mellitus: Secondary | ICD-10-CM | POA: Diagnosis not present

## 2020-08-08 DIAGNOSIS — K5190 Ulcerative colitis, unspecified, without complications: Secondary | ICD-10-CM

## 2020-08-08 DIAGNOSIS — Z8719 Personal history of other diseases of the digestive system: Secondary | ICD-10-CM | POA: Diagnosis not present

## 2020-08-08 HISTORY — PX: COLONOSCOPY: SHX5424

## 2020-08-08 LAB — HM COLONOSCOPY

## 2020-08-08 SURGERY — COLONOSCOPY
Anesthesia: Moderate Sedation

## 2020-08-08 MED ORDER — MEPERIDINE HCL 50 MG/ML IJ SOLN
INTRAMUSCULAR | Status: DC | PRN
Start: 2020-08-08 — End: 2020-08-08
  Administered 2020-08-08 (×2): 25 mg

## 2020-08-08 MED ORDER — MERCAPTOPURINE 50 MG PO TABS: 50.0000 mg | ORAL_TABLET | Freq: Every day | ORAL | 5 refills | Status: DC

## 2020-08-08 MED ORDER — SODIUM CHLORIDE 0.9 % IV SOLN: INTRAVENOUS | Status: DC

## 2020-08-08 MED ORDER — MIDAZOLAM HCL 5 MG/5ML IJ SOLN
INTRAMUSCULAR | Status: AC
  Filled 2020-08-08: qty 10

## 2020-08-08 MED ORDER — MEPERIDINE HCL 50 MG/ML IJ SOLN
INTRAMUSCULAR | Status: AC
  Filled 2020-08-08: qty 1

## 2020-08-08 MED ORDER — MIDAZOLAM HCL 5 MG/5ML IJ SOLN
INTRAMUSCULAR | Status: DC | PRN
  Administered 2020-08-08: 1 mg via INTRAVENOUS
  Administered 2020-08-08 (×3): 2 mg via INTRAVENOUS

## 2020-08-08 MED ORDER — STERILE WATER FOR IRRIGATION IR SOLN
Status: DC | PRN
  Administered 2020-08-08: 100 mL

## 2020-08-08 NOTE — Op Note (Signed)
[facility] Patient Name: Sandra Crane Procedure Date: 08/08/2020 12:48 PM MRN: [id number] Date of Birth: 06-20-1966 Attending MD: Hildred Laser , MD CSN: [id number] Age: 54 Admit Type: Outpatient Procedure:                Colonoscopy Indications:              High risk colon cancer surveillance: Ulcerative                            pancolitis of 8 (or more) years duration Providers:                Hildred Laser, MD, Janeece Riggers, RN, Lambert Mody, Kristine L. Risa Grill, Technician,                            Aram Candela Referring MD:             Redmond [employer], MD Medicines:                Meperidine 50 mg IV, Midazolam 7 mg IV Complications:            No immediate complications. Estimated Blood Loss:     Estimated blood loss: none. Procedure:                Pre-Anesthesia Assessment:                           - Prior to the procedure, a History and Physical                            was performed, and patient medications and                            allergies were reviewed. The patient's tolerance of                            previous anesthesia was also reviewed. The risks                            and benefits of the procedure and the sedation                            options and risks were discussed with the patient.                            All questions were answered, and informed consent                            was obtained. Prior Anticoagulants: The patient has                            taken no previous anticoagulant or antiplatelet  agents. ASA Grade Assessment: II - A patient with                            mild systemic disease. After reviewing the risks                            and benefits, the patient was deemed in                            satisfactory condition to undergo the procedure.                           After obtaining informed consent, the colonoscope                             was passed under direct vision. Throughout the                            procedure, the patient's blood pressure, pulse, and                            oxygen saturations were monitored continuously. The                            [id number]-[id number] ([id number]) scope was introduced through                            the anus and advanced to the the terminal ileum,                            with identification of the appendiceal orifice and                            IC valve. The colonoscopy was somewhat difficult                            due to significant looping. Successful completion                            of the procedure was aided by scope guide. The                            patient tolerated the procedure well. The quality                            of the bowel preparation was good. The terminal                            ileum, ileocecal valve, appendiceal orifice, and                            rectum were photographed. Scope In: 1:22:17 PM Scope Out: 1:43:21 PM Scope Withdrawal Time: 0 hours 9 minutes 55 seconds  Total Procedure Duration: 0 hours 21 minutes 4 seconds  Findings:      The perianal and digital rectal examinations were normal.      The terminal ileum appeared normal.      The colon (entire examined portion) appeared normal.      External hemorrhoids were found during retroflexion. The hemorrhoids       were small. Impression:               - The examined portion of the ileum was normal.                           - The entire examined colon is normal.                           - External hemorrhoids.                           - No specimens collected. Moderate Sedation:      Moderate (conscious) sedation was administered by the endoscopy nurse       and supervised by the endoscopist. The following parameters were       monitored: oxygen saturation, heart rate, blood pressure, CO2       capnography and response to care. Total physician intraservice time was        25 minutes. Recommendation:           - Patient has a contact number available for                            emergencies. The signs and symptoms of potential                            delayed complications were discussed with the                            patient. Return to normal activities tomorrow.                            Written discharge instructions were provided to the                            patient.                           - Resume previous diet today.                           - Continue present medications.                           - Repeat colonoscopy in 5 years for surveillance. Procedure Code(s):        --- Professional ---                           [phone], Colonoscopy, flexible; diagnostic, including                            collection  of specimen(s) by brushing or washing,                            when performed (separate procedure)                           99153, Moderate sedation; each additional 15                            minutes intraservice time                           G0500, Moderate sedation services provided by the                            same physician or other qualified health care                            professional performing a gastrointestinal                            endoscopic service that sedation supports,                            requiring the presence of an independent trained                            observer to assist in the monitoring of the                            patient's level of consciousness and physiological                            status; initial 15 minutes of intra-service time;                            patient age 63 years or older (additional time may                            be reported with [id number], as appropriate) Diagnosis Code(s):        --- Professional ---                           K51.00, Ulcerative (chronic) pancolitis without                            complications                            K64.4, Residual hemorrhoidal skin tags CPT copyright 2019 American Medical Association. All rights reserved. The codes documented in this report are preliminary and upon coder review may  be revised to meet current compliance requirements. Hildred Laser, MD Hildred Laser, MD 08/08/2020 1:50:32 PM This report has been signed electronically. Number of Addenda: 0

## 2020-08-08 NOTE — Discharge Instructions (Signed)
Resume usual medications and diet as before. No driving for 24 hours. Next colonoscopy in 5 years.      Colonoscopy, Adult, Care After This sheet gives you information about how to care for yourself after your procedure. Your doctor may also give you more specific instructions. If you have problems or questions, call your doctor. What can I expect after the procedure? After the procedure, it is common to have:  A small amount of blood in your poop (stool) for 24 hours.  Some gas.  Mild cramping or bloating in your belly (abdomen). Follow these instructions at home: Eating and drinking   Drink enough fluid to keep your pee (urine) pale yellow.  Follow instructions from your doctor about what you cannot eat or drink.  Return to your normal diet as told by your doctor. Avoid heavy or fried foods that are hard to digest. Activity  Rest as told by your doctor.  Do not sit for a long time without moving. Get up to take short walks every 1-2 hours. This is important. Ask for help if you feel weak or unsteady.  Return to your normal activities as told by your doctor. Ask your doctor what activities are safe for you. To help cramping and bloating:   Try walking around.  Put heat on your belly as told by your doctor. Use the heat source that your doctor recommends, such as a moist heat pack or a heating pad. ? Put a towel between your skin and the heat source. ? Leave the heat on for 20-30 minutes. ? Remove the heat if your skin turns bright red. This is very important if you are unable to feel pain, heat, or cold. You may have a greater risk of getting burned. General instructions  For the first 24 hours after the procedure: ? Do not drive or use machinery. ? Do not sign important documents. ? Do not drink alcohol. ? Do your daily activities more slowly than normal. ? Eat foods that are soft and easy to digest.  Take over-the-counter or prescription medicines only as told  by your doctor.  Keep all follow-up visits as told by your doctor. This is important. Contact a doctor if:  You have blood in your poop 2-3 days after the procedure. Get help right away if:  You have more than a small amount of blood in your poop.  You see large clumps of tissue (blood clots) in your poop.  Your belly is swollen.  You feel like you may vomit (nauseous).  You vomit.  You have a fever.  You have belly pain that gets worse, and medicine does not help your pain. Summary  After the procedure, it is common to have a small amount of blood in your poop. You may also have mild cramping and bloating in your belly.  For the first 24 hours after the procedure, do not drive or use machinery, do not sign important documents, and do not drink alcohol.  Get help right away if you have a lot of blood in your poop, feel like you may vomit, have a fever, or have more belly pain. This information is not intended to replace advice given to you by your health care provider. Make sure you discuss any questions you have with your health care provider. Document Revised: 05/22/2019 Document Reviewed: 05/22/2019 Elsevier Patient Education  [place].    Hemorrhoids Hemorrhoids are swollen veins in and around the rectum or anus. There are two  types of hemorrhoids:  Internal hemorrhoids. These occur in the veins that are just inside the rectum. They may poke through to the outside and become irritated and painful.  External hemorrhoids. These occur in the veins that are outside the anus and can be felt as a painful swelling or hard lump near the anus. Most hemorrhoids do not cause serious problems, and they can be managed with home treatments such as diet and lifestyle changes. If home treatments do not help the symptoms, procedures can be done to shrink or remove the hemorrhoids. What are the causes? This condition is caused by increased pressure in the anal area. This  pressure may result from various things, including:  Constipation.  Straining to have a bowel movement.  Diarrhea.  Pregnancy.  Obesity.  Sitting for long periods of time.  Heavy lifting or other activity that causes you to strain.  Anal sex.  Riding a bike for a long period of time. What are the signs or symptoms? Symptoms of this condition include:  Pain.  Anal itching or irritation.  Rectal bleeding.  Leakage of stool (feces).  Anal swelling.  One or more lumps around the anus. How is this diagnosed? This condition can often be diagnosed through a visual exam. Other exams or tests may also be done, such as:  An exam that involves feeling the rectal area with a gloved hand (digital rectal exam).  An exam of the anal canal that is done using a small tube (anoscope).  A blood test, if you have lost a significant amount of blood.  A test to look inside the colon using a flexible tube with a camera on the end (sigmoidoscopy or colonoscopy). How is this treated? This condition can usually be treated at home. However, various procedures may be done if dietary changes, lifestyle changes, and other home treatments do not help your symptoms. These procedures can help make the hemorrhoids smaller or remove them completely. Some of these procedures involve surgery, and others do not. Common procedures include:  Rubber band ligation. Rubber bands are placed at the base of the hemorrhoids to cut off their blood supply.  Sclerotherapy. Medicine is injected into the hemorrhoids to shrink them.  Infrared coagulation. A type of light energy is used to get rid of the hemorrhoids.  Hemorrhoidectomy surgery. The hemorrhoids are surgically removed, and the veins that supply them are tied off.  Stapled hemorrhoidopexy surgery. The surgeon staples the base of the hemorrhoid to the rectal wall. Follow these instructions at home: Eating and drinking   Eat foods that have a lot of  fiber in them, such as whole grains, beans, nuts, fruits, and vegetables.  Ask your health care provider about taking products that have added fiber (fiber supplements).  Reduce the amount of fat in your diet. You can do this by eating low-fat dairy products, eating less red meat, and avoiding processed foods.  Drink enough fluid to keep your urine pale yellow. Managing pain and swelling   Take warm sitz baths for 20 minutes, 3-4 times a day to ease pain and discomfort. You may do this in a bathtub or using a portable sitz bath that fits over the toilet.  If directed, apply ice to the affected area. Using ice packs between sitz baths may be helpful. ? Put ice in a plastic bag. ? Place a towel between your skin and the bag. ? Leave the ice on for 20 minutes, 2-3 times a day. General instructions  Take  over-the-counter and prescription medicines only as told by your health care provider.  Use medicated creams or suppositories as told.  Get regular exercise. Ask your health care provider how much and what kind of exercise is best for you. In general, you should do moderate exercise for at least 30 minutes on most days of the week (150 minutes each week). This can include activities such as walking, biking, or yoga.  Go to the bathroom when you have the urge to have a bowel movement. Do not wait.  Avoid straining to have bowel movements.  Keep the anal area dry and clean. Use wet toilet paper or moist towelettes after a bowel movement.  Do not sit on the toilet for long periods of time. This increases blood pooling and pain.  Keep all follow-up visits as told by your health care provider. This is important. Contact a health care provider if you have:  Increasing pain and swelling that are not controlled by treatment or medicine.  Difficulty having a bowel movement, or you are unable to have a bowel movement.  Pain or inflammation outside the area of the hemorrhoids. Get help right  away if you have:  Uncontrolled bleeding from your rectum. Summary  Hemorrhoids are swollen veins in and around the rectum or anus.  Most hemorrhoids can be managed with home treatments such as diet and lifestyle changes.  Taking warm sitz baths can help ease pain and discomfort.  In severe cases, procedures or surgery can be done to shrink or remove the hemorrhoids. This information is not intended to replace advice given to you by your health care provider. Make sure you discuss any questions you have with your health care provider. Document Revised: 03/24/2019 Document Reviewed: 03/17/2018 Elsevier Patient Education  [place].

## 2020-08-08 NOTE — H&P (Signed)
Sandra Crane is an 54 y.o. female.   Chief Complaint: Patient is here for colonoscopy. 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  Last exam was 5 years ago and was unremarkable.  She is on oral mesalamine and low-dose 6-MP.  She has formed stool daily.  She denies abdominal pain or rectal bleeding. Family history is negative for IBD or colorectal carcinoma.  Past Medical History:  Diagnosis Date  . Contraceptive management 09/12/2013  . Elevated cholesterol   . Ulcerative colitis     Past Surgical History:  Procedure Laterality Date  . CESAREAN SECTION  [phone]  . COLONOSCOPY    . COLONOSCOPY N/A 10/24/2015   Procedure: COLONOSCOPY;  Surgeon: Rogene Houston, MD;  Location: [facility];  Service: Endoscopy;  Laterality: N/A;  12:00  . FOOT SURGERY Right 02/21/2020  . METATARSAL OSTEOTOMY Right 02/21/2020   Procedure: DISTAL METATARSAL OSTEOTOMY;  Surgeon: Tyson Babinski, DPM;  Location: [facility];  Service: Podiatry;  Laterality: Right;  . REPAIR EXTENSOR TENDON Right 02/21/2020   Procedure: LENGTHENING OF EXTENSOR TENDON;  Surgeon: Tyson Babinski, DPM;  Location: [facility];  Service: Podiatry;  Laterality: Right;  . root canal with crown    . SIGMOIDOSCOPY  07/19/2009  . TONSILLECTOMY AND ADENOIDECTOMY N/A 11/10/2016   Procedure: TONSILLECTOMY AND ADENOIDECTOMY;  Surgeon: Leta Baptist, MD;  Location: [place];  Service: ENT;  Laterality: N/A;  . WISDOM TOOTH EXTRACTION      Family History  Problem Relation Age of Onset  . Thyroid cancer Mother   . Diabetes Mother   . Cancer Mother   . Diabetes Father   . Hypertension Sister   . Hyperlipidemia Sister   . Diabetes Sister   . Healthy Brother   . Healthy Sister   . Healthy Brother   . Glaucoma Brother   . Healthy Daughter   . Healthy Daughter   . Healthy Son    Social History:  reports that she  has never smoked. She has never used smokeless tobacco. She reports current alcohol use. She reports that she does not use drugs.  Allergies: No Known Allergies  Medications Prior to Admission  Medication Sig Dispense Refill  . Boswellia-Glucosamine-Vit D (OSTEO BI-FLEX ONE PER DAY PO) Take 1 tablet by mouth daily.     . mercaptopurine (PURINETHOL) 50 MG tablet TAKE 1 TABLET BY MOUTH DAILY ON EMPTY STOMACH 1 HOUR BEFORE MEALS OR 2 HOURS AFTER MEALS (Patient taking differently: Take 37.5 mg by mouth at bedtime. 3/4 of tablet EMPTY STOMACH 1 HOUR BEFORE MEALS OR 2 HOURS AFTER MEALS) 30 tablet 3  . mesalamine (LIALDA) 1.2 g EC tablet TAKE 2 TABLETS BY MOUTH TWICE DAILY (Patient taking differently: Take 2.4 g by mouth in the morning and at bedtime. ) 120 tablet 5  . Multiple Vitamin (MULTIVITAMIN WITH MINERALS) TABS tablet Take 1 tablet by mouth daily. With Iron    . progesterone (PROMETRIUM) 200 MG capsule Take 1 capsule (200 mg total) by mouth daily. (Patient taking differently: Take 200 mg by mouth at bedtime. ) 90 capsule 3  . simvastatin (ZOCOR) 40 MG tablet TAKE 1 TABLET(40 MG) BY MOUTH EVERY EVENING (Patient taking differently: Take 40 mg by mouth at bedtime. ) 30 tablet 5    No results found for this or any previous visit (from the past 48 hour(s)). No results found.  Review of Systems  Blood pressure  107/68, pulse 65, temperature 98.2 F (36.8 C), temperature source Oral, resp. rate (!) 22, height 5' 3"  (1.6 m), weight 59.4 kg, SpO2 100 %. Physical Exam HENT:     Mouth/Throat:     Mouth: Mucous membranes are moist.     Pharynx: Oropharynx is clear.  Eyes:     General: No scleral icterus.    Conjunctiva/sclera: Conjunctivae normal.  Cardiovascular:     Rate and Rhythm: Normal rate and regular rhythm.     Heart sounds: Normal heart sounds. No murmur heard.   Pulmonary:     Effort: Pulmonary effort is normal.     Breath sounds: Normal breath sounds.  Abdominal:     General:  Abdomen is flat. There is no distension.     Palpations: There is no mass.     Tenderness: There is no abdominal tenderness.  Musculoskeletal:        General: No swelling.     Cervical back: Neck supple.  Lymphadenopathy:     Cervical: No cervical adenopathy.  Skin:    General: Skin is warm and dry.  Neurological:     Mental Status: She is alert.      Assessment/Plan Chronic pan ulcerative colitis. Disease duration 15 years. Surveillance colonoscopy.  Hildred Laser, MD 08/08/2020, 1:15 PM

## 2020-08-09 ENCOUNTER — Telehealth (INDEPENDENT_AMBULATORY_CARE_PROVIDER_SITE_OTHER): Payer: Self-pay | Admitting: Internal Medicine

## 2020-08-09 NOTE — Telephone Encounter (Signed)
Patient left voice mail message stating she had a colonoscopy yesterday and before the procedure she was taking a lower dose of her medication and he put her back on 50 mg - wants to know if she is supposed to split the medication up - please advise - ph# [phone]

## 2020-08-09 NOTE — Telephone Encounter (Signed)
The medication that she is talking about is the 6 MP. To address with Dr.Rehman.

## 2020-08-09 NOTE — Telephone Encounter (Signed)
I talked with Dr.Rehman. He ask that the patient take 1/2 of the 6MP (25 mg) total. When she has her next lab work he will make further recommendation. Patient was called and made aware.

## 2020-08-12 ENCOUNTER — Encounter (INDEPENDENT_AMBULATORY_CARE_PROVIDER_SITE_OTHER): Payer: Self-pay | Admitting: *Deleted

## 2020-08-14 ENCOUNTER — Encounter (HOSPITAL_COMMUNITY): Payer: Self-pay | Admitting: Internal Medicine

## 2020-08-20 ENCOUNTER — Other Ambulatory Visit (INDEPENDENT_AMBULATORY_CARE_PROVIDER_SITE_OTHER): Payer: Self-pay | Admitting: Internal Medicine

## 2020-10-30 ENCOUNTER — Other Ambulatory Visit (INDEPENDENT_AMBULATORY_CARE_PROVIDER_SITE_OTHER): Payer: Self-pay | Admitting: *Deleted

## 2020-10-30 DIAGNOSIS — R7989 Other specified abnormal findings of blood chemistry: Secondary | ICD-10-CM

## 2020-10-30 DIAGNOSIS — K5190 Ulcerative colitis, unspecified, without complications: Secondary | ICD-10-CM

## 2020-10-30 DIAGNOSIS — E7800 Pure hypercholesterolemia, unspecified: Secondary | ICD-10-CM

## 2020-11-08 ENCOUNTER — Other Ambulatory Visit (INDEPENDENT_AMBULATORY_CARE_PROVIDER_SITE_OTHER): Payer: Self-pay | Admitting: Internal Medicine

## 2020-11-08 LAB — LIPID PANEL
Cholesterol: 178 mg/dL (ref <200)
HDL: 53 mg/dL (ref >=50)
LDL Cholesterol (Calc): 93 mg/dL (calc)
Non-HDL Cholesterol (Calc): 125 mg/dL (calc) (ref <130)
Total CHOL/HDL Ratio: 3.4 (calc) (ref <5.0)
Triglycerides: 220 mg/dL — ABNORMAL HIGH (ref <150)

## 2020-11-08 LAB — HEPATIC FUNCTION PANEL
AG Ratio: 2 (calc) (ref 1.0–2.5)
ALT: 15 U/L (ref 6–29)
AST: 15 U/L (ref 10–35)
Albumin: 4.3 g/dL (ref 3.6–5.1)
Alkaline phosphatase (APISO): 60 U/L (ref 37–153)
Bilirubin, Direct: 0.1 mg/dL (ref 0.0–0.2)
Globulin: 2.1 g/dL (calc) (ref 1.9–3.7)
Indirect Bilirubin: 0.3 mg/dL (calc) (ref 0.2–1.2)
Total Bilirubin: 0.4 mg/dL (ref 0.2–1.2)
Total Protein: 6.4 g/dL (ref 6.1–8.1)

## 2020-11-08 LAB — CBC WITH DIFFERENTIAL/PLATELET
Absolute Monocytes: 515 cells/uL (ref 200–950)
Basophils Absolute: 20 cells/uL (ref 0–200)
Basophils Relative: 0.6 %
Eosinophils Absolute: 40 cells/uL (ref 15–500)
Eosinophils Relative: 1.2 %
HCT: 39.5 % (ref 35.0–45.0)
Hemoglobin: 13.3 g/dL (ref 11.7–15.5)
Lymphs Abs: 584 cells/uL — ABNORMAL LOW (ref 850–3900)
MCH: 33.3 pg — ABNORMAL HIGH (ref 27.0–33.0)
MCHC: 33.7 g/dL (ref 32.0–36.0)
MCV: 99 fL (ref 80.0–100.0)
MPV: 9.6 fL (ref 7.5–12.5)
Monocytes Relative: 15.6 %
Neutro Abs: 2142 cells/uL (ref 1500–7800)
Neutrophils Relative %: 64.9 %
Platelets: 237 10*3/uL (ref 140–400)
RBC: 3.99 10*6/uL (ref 3.80–5.10)
RDW: 11.9 % (ref 11.0–15.0)
Total Lymphocyte: 17.7 %
WBC: 3.3 10*3/uL — ABNORMAL LOW (ref 3.8–10.8)

## 2020-11-15 ENCOUNTER — Other Ambulatory Visit (INDEPENDENT_AMBULATORY_CARE_PROVIDER_SITE_OTHER): Payer: Self-pay | Admitting: *Deleted

## 2020-11-15 DIAGNOSIS — R7989 Other specified abnormal findings of blood chemistry: Secondary | ICD-10-CM

## 2020-11-15 DIAGNOSIS — R945 Abnormal results of liver function studies: Secondary | ICD-10-CM

## 2020-11-15 DIAGNOSIS — K51918 Ulcerative colitis, unspecified with other complication: Secondary | ICD-10-CM

## 2020-11-15 DIAGNOSIS — K5190 Ulcerative colitis, unspecified, without complications: Secondary | ICD-10-CM

## 2020-11-21 ENCOUNTER — Ambulatory Visit (HOSPITAL_COMMUNITY)
Admission: RE | Admit: 2020-11-21 | Discharge: 2020-11-21 | Disposition: A | Discharge status: Home / Self Care | Payer: BC Managed Care – PPO | Source: Ambulatory Visit | Attending: Adult Health | Admitting: Adult Health

## 2020-11-21 ENCOUNTER — Other Ambulatory Visit: Payer: Self-pay

## 2020-11-21 ENCOUNTER — Other Ambulatory Visit (HOSPITAL_COMMUNITY)
Admission: RE | Admit: 2020-11-21 | Discharge: 2020-11-21 | Disposition: A | Discharge status: Home / Self Care | Payer: BC Managed Care – PPO | Source: Ambulatory Visit | Attending: Adult Health | Admitting: Adult Health

## 2020-11-21 ENCOUNTER — Encounter: Payer: Self-pay | Admitting: Adult Health

## 2020-11-21 ENCOUNTER — Ambulatory Visit (INDEPENDENT_AMBULATORY_CARE_PROVIDER_SITE_OTHER): Payer: BC Managed Care – PPO | Admitting: Adult Health

## 2020-11-21 VITALS — BP 125/67 | HR 85 | Ht 63.0 in | Wt 141.2 lb

## 2020-11-21 DIAGNOSIS — R221 Localized swelling, mass and lump, neck: Secondary | ICD-10-CM

## 2020-11-21 DIAGNOSIS — Z1211 Encounter for screening for malignant neoplasm of colon: Secondary | ICD-10-CM | POA: Insufficient documentation

## 2020-11-21 DIAGNOSIS — Z01419 Encounter for gynecological examination (general) (routine) without abnormal findings: Secondary | ICD-10-CM | POA: Insufficient documentation

## 2020-11-21 DIAGNOSIS — Z1151 Encounter for screening for human papillomavirus (HPV): Secondary | ICD-10-CM | POA: Diagnosis not present

## 2020-11-21 LAB — HEMOCCULT GUIAC POC 1CARD (OFFICE): Fecal Occult Blood, POC: NEGATIVE

## 2020-11-21 NOTE — Progress Notes (Signed)
Patient ID: Sandra Crane, female   DOB: [birth year], 55 y.o.   MRN: [id number] History of Present Illness: Sandra Crane is a 55 year old white female.married, PM on Prometrium daily, no vaginal bleeding, in for well woman gyn exam and pap.  She has knot on back of neck and it hurts at times, she has appt with Dr Benjamine Mola 12/02/20. PCP is Dr Gerarda Fraction.   Current Medications, Allergies, Past Medical History, Past Surgical History, Family History and Social History were reviewed in [place] record.     Review of Systems: Patient denies any headaches, hearing loss, fatigue, blurred vision, shortness of breath, chest pain, abdominal pain, problems with bowel movements, urination, or intercourse. No joint pain or mood swings. No vaginal bleeding Knot on back of neck    Physical Exam:BP 125/67 (BP Location: Right Arm, Patient Position: Sitting, Cuff Size: Normal)   Pulse 85   Ht 5' 3"  (1.6 m)   Wt 141 lb 3.2 oz (64 kg)   BMI 25.01 kg/m  General:  Well developed, well nourished, no acute distress Skin:  Warm and dry Neck:  Midline trachea, normal thyroid, good ROM, no lymphadenopathy,has 1 cm firm mass on back of neck Lungs; Clear to auscultation bilaterally Breast:  No dominant palpable mass, retraction, or nipple discharge Cardiovascular: Regular rate and rhythm Abdomen:  Soft, non tender, no hepatosplenomegaly Pelvic:  External genitalia is normal in appearance, no lesions.  The vagina is normal in appearance. Urethra has no lesions or masses. The cervix is bulbous.Pap with HR HPV genotyping performed.   Uterus is felt to be normal size, shape, and contour.  No adnexal masses or tenderness noted.Bladder is non tender, no masses felt. Rectal: Good sphincter tone, no polyps, or hemorrhoids felt.  Hemoccult negative. Extremities/musculoskeletal:  No swelling or varicosities noted, no clubbing or cyanosis Psych:  No mood changes, alert and cooperative,seems happy AA is  1 Fall risk is low  PHQ 9 score is 0 GAD 7 score is 0  [facility] - 11/21/20 1442      Pregnancy Intention Screening   Does the patient want to become pregnant in the next year? No    Does the patient's partner want to become pregnant in the next year? No    Would the patient like to discuss contraceptive options today? No      Contraception Wrap Up   Current Method No Method - Other Reason   post-menopausal   End Method No Method - Other Reason   PM   Contraception Counseling Provided No         Examination chaperoned by Diona Fanti CMA.  Impression and Plan: 1. Encounter for gynecological examination with Papanicolaou smear of cervix Pap sent Physical in 1 year Pap in 3 if normal Mammogram yearly Colonoscopy per [facility] with Dr Laural Golden Follow up with Dr Elonda Husky in 3 months on Prometrium   2. Encounter for screening fecal occult blood testing   3. Mass in neck Xray neck today at [facility]

## 2020-11-21 NOTE — Addendum Note (Signed)
Addended by: Dorita Sciara, Jayliah Benett A on: 11/21/2020 03:57 PM   Modules accepted: Orders

## 2020-11-26 LAB — CYTOLOGY - PAP
Comment: NEGATIVE
Diagnosis: NEGATIVE
High risk HPV: NEGATIVE

## 2021-01-28 ENCOUNTER — Other Ambulatory Visit: Payer: Self-pay | Admitting: Obstetrics & Gynecology

## 2021-02-20 ENCOUNTER — Ambulatory Visit: Payer: BC Managed Care – PPO | Admitting: Obstetrics & Gynecology

## 2021-02-20 LAB — CBC WITH DIFFERENTIAL/PLATELET
Absolute Monocytes: 504 cells/uL (ref 200–950)
Basophils Absolute: 21 cells/uL (ref 0–200)
Basophils Relative: 0.5 %
Eosinophils Absolute: 29 cells/uL (ref 15–500)
Eosinophils Relative: 0.7 %
HCT: 40.2 % (ref 35.0–45.0)
Hemoglobin: 13.1 g/dL (ref 11.7–15.5)
Lymphs Abs: 746 cells/uL — ABNORMAL LOW (ref 850–3900)
MCH: 32 pg (ref 27.0–33.0)
MCHC: 32.6 g/dL (ref 32.0–36.0)
MCV: 98 fL (ref 80.0–100.0)
MPV: 9.2 fL (ref 7.5–12.5)
Monocytes Relative: 12.3 %
Neutro Abs: 2800 cells/uL (ref 1500–7800)
Neutrophils Relative %: 68.3 %
Platelets: 290 10*3/uL (ref 140–400)
RBC: 4.1 10*6/uL (ref 3.80–5.10)
RDW: 12.1 % (ref 11.0–15.0)
Total Lymphocyte: 18.2 %
WBC: 4.1 10*3/uL (ref 3.8–10.8)

## 2021-02-20 LAB — HEPATIC FUNCTION PANEL
AG Ratio: 1.9 (calc) (ref 1.0–2.5)
ALT: 13 U/L (ref 6–29)
AST: 17 U/L (ref 10–35)
Albumin: 4.4 g/dL (ref 3.6–5.1)
Alkaline phosphatase (APISO): 63 U/L (ref 37–153)
Bilirubin, Direct: 0.1 mg/dL (ref 0.0–0.2)
Globulin: 2.3 g/dL (calc) (ref 1.9–3.7)
Indirect Bilirubin: 0.4 mg/dL (calc) (ref 0.2–1.2)
Total Bilirubin: 0.5 mg/dL (ref 0.2–1.2)
Total Protein: 6.7 g/dL (ref 6.1–8.1)

## 2021-02-24 ENCOUNTER — Ambulatory Visit: Payer: BC Managed Care – PPO | Admitting: Obstetrics & Gynecology

## 2021-02-25 ENCOUNTER — Other Ambulatory Visit: Payer: Self-pay

## 2021-02-25 ENCOUNTER — Ambulatory Visit (INDEPENDENT_AMBULATORY_CARE_PROVIDER_SITE_OTHER): Payer: BC Managed Care – PPO | Admitting: Obstetrics & Gynecology

## 2021-02-25 ENCOUNTER — Ambulatory Visit: Payer: BC Managed Care – PPO | Admitting: Obstetrics & Gynecology

## 2021-02-25 ENCOUNTER — Encounter: Payer: Self-pay | Admitting: Obstetrics & Gynecology

## 2021-02-25 VITALS — BP 128/78 | HR 59 | Ht 63.0 in | Wt 147.0 lb

## 2021-02-25 DIAGNOSIS — R9389 Abnormal findings on diagnostic imaging of other specified body structures: Secondary | ICD-10-CM

## 2021-02-25 DIAGNOSIS — N912 Amenorrhea, unspecified: Secondary | ICD-10-CM | POA: Diagnosis not present

## 2021-02-25 DIAGNOSIS — N950 Postmenopausal bleeding: Secondary | ICD-10-CM | POA: Diagnosis not present

## 2021-02-25 MED ORDER — PROGESTERONE 200 MG PO CAPS
ORAL_CAPSULE | ORAL | 3 refills | Status: DC
Start: 2021-02-25 — End: 2022-11-25

## 2021-02-25 NOTE — Progress Notes (Signed)
Chief Complaint  Patient presents with  . Follow-up 3 months    On prometrium      55 y.o. G3P3 No LMP recorded. Patient is postmenopausal. The current method of family planning is status post hysterectomy.  Outpatient Encounter Medications as of 02/25/2021  Medication Sig  . Boswellia-Glucosamine-Vit D (OSTEO BI-FLEX ONE PER DAY PO) Take 1 tablet by mouth daily.   . mesalamine (LIALDA) 1.2 g EC tablet TAKE 2 TABLETS BY MOUTH TWICE DAILY  . Multiple Vitamin (MULTIVITAMIN WITH MINERALS) TABS tablet Take 1 tablet by mouth daily. With Iron  . simvastatin (ZOCOR) 40 MG tablet TAKE 1 TABLET(40 MG) BY MOUTH EVERY EVENING (Patient taking differently: Take 40 mg by mouth at bedtime.)  . [DISCONTINUED] progesterone (PROMETRIUM) 200 MG capsule TAKE 1 CAPSULE(200 MG) BY MOUTH DAILY  . progesterone (PROMETRIUM) 200 MG capsule TAKE 1 CAPSULE(200 MG) BY MOUTH DAILY   No facility-administered encounter medications on file as of 02/25/2021.    Subjective Pt had post peri menopausal bleeding along with a thickened endometrium Placed on prometrium which stopped bleeding and thinned endometrium  Has been on for 1 year total amenorrhea  Will now try cycling 10 days per month Past Medical History:  Diagnosis Date  . Arthritis   . Contraceptive management 09/12/2013  . Elevated cholesterol   . Ulcerative colitis     Past Surgical History:  Procedure Laterality Date  . CESAREAN SECTION  [phone]  . COLONOSCOPY    . COLONOSCOPY N/A 10/24/2015   Procedure: COLONOSCOPY;  Surgeon: Rogene Houston, MD;  [facility];  Service: Endoscopy;  Laterality: N/A;  12:00  . COLONOSCOPY N/A 08/08/2020   Procedure: COLONOSCOPY;  Surgeon: Rogene Houston, MD;  [facility];  Service: Endoscopy;  Laterality: N/A;  100  . FOOT SURGERY Right 02/21/2020  . METATARSAL OSTEOTOMY Right 02/21/2020   Procedure: DISTAL METATARSAL OSTEOTOMY;  Surgeon: Tyson Babinski, DPM;  Location:  [facility];  Service: Podiatry;  Laterality: Right;  . REPAIR EXTENSOR TENDON Right 02/21/2020   Procedure: LENGTHENING OF EXTENSOR TENDON;  Surgeon: Tyson Babinski, DPM;  Location: [facility];  Service: Podiatry;  Laterality: Right;  . root canal with crown    . SIGMOIDOSCOPY  07/19/2009  . TONSILLECTOMY AND ADENOIDECTOMY N/A 11/10/2016   Procedure: TONSILLECTOMY AND ADENOIDECTOMY;  Surgeon: Leta Baptist, MD;  Location: [place];  Service: ENT;  Laterality: N/A;  . WISDOM TOOTH EXTRACTION      OB History    Gravida  3   Para  3   Term      Preterm      AB      Living  3     SAB      IAB      Ectopic      Multiple      Live Births  3           Allergies  Allergen Reactions  . Tramadol Other (See Comments)    Insomnia    Social History   Socioeconomic History  . Marital status: Married    Spouse name: Not on file  . Number of children: 3  . Years of education: Not on file  . Highest education level: Not on file  Occupational History  . Not on file  Tobacco Use  . Smoking status: Never Smoker  . Smokeless tobacco: Never Used  Vaping Use  . Vaping Use: Never used  Substance and Sexual  Activity  . Alcohol use: Yes    Comment: occ  . Drug use: No  . Sexual activity: Yes    Birth control/protection: Post-menopausal  Other Topics Concern  . Not on file  Social History Narrative  . Not on file   Social Determinants of Health   Financial Resource Strain: Low Risk   . Difficulty of Paying Living Expenses: Not hard at all  Food Insecurity: No Food Insecurity  . Worried About Charity fundraiser in the Last Year: Never true  . Ran Out of Food in the Last Year: Never true  Transportation Needs: No Transportation Needs  . Lack of Transportation (Medical): No  . Lack of Transportation (Non-Medical): No  Physical Activity: Insufficiently Active  . Days of Exercise per Week: 2 days  . Minutes of Exercise per Session: 30 min  Stress: No Stress  Concern Present  . Feeling of Stress : Not at all  Social Connections: Moderately Isolated  . Frequency of Communication with Friends and Family: More than three times a week  . Frequency of Social Gatherings with Friends and Family: Twice a week  . Attends Religious Services: Never  . Active Member of Clubs or Organizations: No  . Attends Archivist Meetings: Never  . Marital Status: Married    Family History  Problem Relation Age of Onset  . Thyroid cancer Mother   . Diabetes Mother   . Cancer Mother   . Diabetes Father   . Hypertension Sister   . Hyperlipidemia Sister   . Diabetes Sister   . Healthy Brother   . Healthy Sister   . Healthy Brother   . Glaucoma Brother   . Healthy Daughter   . Healthy Daughter   . Healthy Son     Medications:       Current Outpatient Medications:  .  Boswellia-Glucosamine-Vit D (OSTEO BI-FLEX ONE PER DAY PO), Take 1 tablet by mouth daily. , Disp: , Rfl:  .  mesalamine (LIALDA) 1.2 g EC tablet, TAKE 2 TABLETS BY MOUTH TWICE DAILY, Disp: 120 tablet, Rfl: 5 .  Multiple Vitamin (MULTIVITAMIN WITH MINERALS) TABS tablet, Take 1 tablet by mouth daily. With Iron, Disp: , Rfl:  .  simvastatin (ZOCOR) 40 MG tablet, TAKE 1 TABLET(40 MG) BY MOUTH EVERY EVENING (Patient taking differently: Take 40 mg by mouth at bedtime.), Disp: 30 tablet, Rfl: 5 .  progesterone (PROMETRIUM) 200 MG capsule, TAKE 1 CAPSULE(200 MG) BY MOUTH DAILY, Disp: 90 capsule, Rfl: 3  Objective Blood pressure 128/78, pulse (!) 59, height 5' 3"  (1.6 m), weight 147 lb (66.7 kg).  Gen WDWN NAD  Pertinent ROS No burning with urination, frequency or urgency No nausea, vomiting or diarrhea Nor fever chills or other constitutional symptoms   Labs or studies     Impression Diagnoses this Encounter::   ICD-10-CM   1. Amenorrhea on prometrium x 1 year  N91.2    will begin to cycle the first 10 days of each month  2. PMB (postmenopausal bleeding): resolved on prometrium   N95.0   3. Thickened endometrium: resolved on prometrium continuous  R93.89     Established relevant diagnosis(es):   Plan/Recommendations: Meds ordered this encounter  Medications  . progesterone (PROMETRIUM) 200 MG capsule    Sig: TAKE 1 CAPSULE(200 MG) BY MOUTH DAILY    Dispense:  90 capsule    Refill:  3    Labs or Scans Ordered: No orders of the defined types were placed in this  encounter.   Management:: Cycle prometrium 10 for 1 year then trial 10 days every 3 months for 1 year then off hopefully depending on symtpoms  Follow up Return in about 1 year (around 02/25/2022) for Follow up, with Dr Elonda Husky.      All questions were answered.

## 2021-02-27 ENCOUNTER — Other Ambulatory Visit (INDEPENDENT_AMBULATORY_CARE_PROVIDER_SITE_OTHER): Payer: Self-pay | Admitting: Internal Medicine

## 2021-02-27 MED ORDER — SIMVASTATIN 40 MG PO TABS
40.0000 mg | ORAL_TABLET | Freq: Every day | ORAL | 3 refills | Status: DC
Start: 2021-02-27 — End: 2022-03-04

## 2021-03-06 ENCOUNTER — Other Ambulatory Visit (INDEPENDENT_AMBULATORY_CARE_PROVIDER_SITE_OTHER): Payer: Self-pay

## 2021-03-06 MED ORDER — MESALAMINE 1.2 G PO TBEC
2.4000 g | DELAYED_RELEASE_TABLET | Freq: Two times a day (BID) | ORAL | 5 refills | Status: DC
Start: 2021-03-06 — End: 2021-07-17

## 2021-04-25 ENCOUNTER — Other Ambulatory Visit: Payer: Self-pay | Admitting: Adult Health

## 2021-04-25 DIAGNOSIS — Z1231 Encounter for screening mammogram for malignant neoplasm of breast: Secondary | ICD-10-CM

## 2021-06-09 ENCOUNTER — Other Ambulatory Visit (INDEPENDENT_AMBULATORY_CARE_PROVIDER_SITE_OTHER): Payer: Self-pay

## 2021-06-09 ENCOUNTER — Encounter (INDEPENDENT_AMBULATORY_CARE_PROVIDER_SITE_OTHER): Payer: Self-pay

## 2021-06-09 DIAGNOSIS — R5383 Other fatigue: Secondary | ICD-10-CM

## 2021-06-09 DIAGNOSIS — R7989 Other specified abnormal findings of blood chemistry: Secondary | ICD-10-CM

## 2021-06-09 DIAGNOSIS — E7800 Pure hypercholesterolemia, unspecified: Secondary | ICD-10-CM

## 2021-06-09 DIAGNOSIS — E785 Hyperlipidemia, unspecified: Secondary | ICD-10-CM

## 2021-06-11 ENCOUNTER — Encounter (INDEPENDENT_AMBULATORY_CARE_PROVIDER_SITE_OTHER): Payer: Self-pay

## 2021-06-12 NOTE — Telephone Encounter (Signed)
Hey Sandra Crane. I see you put in some lab orders on 8/1 but I dont see a message

## 2021-06-14 LAB — CBC WITH DIFFERENTIAL/PLATELET
Absolute Monocytes: 520 cells/uL (ref 200–950)
Basophils Absolute: 20 cells/uL (ref 0–200)
Basophils Relative: 0.6 %
Eosinophils Absolute: 51 cells/uL (ref 15–500)
Eosinophils Relative: 1.5 %
HCT: 40.5 % (ref 35.0–45.0)
Hemoglobin: 13.5 g/dL (ref 11.7–15.5)
Lymphs Abs: 779 cells/uL — ABNORMAL LOW (ref 850–3900)
MCH: 32 pg (ref 27.0–33.0)
MCHC: 33.3 g/dL (ref 32.0–36.0)
MCV: 96 fL (ref 80.0–100.0)
MPV: 9.5 fL (ref 7.5–12.5)
Monocytes Relative: 15.3 %
Neutro Abs: 2030 cells/uL (ref 1500–7800)
Neutrophils Relative %: 59.7 %
Platelets: 258 10*3/uL (ref 140–400)
RBC: 4.22 10*6/uL (ref 3.80–5.10)
RDW: 12.1 % (ref 11.0–15.0)
Total Lymphocyte: 22.9 %
WBC: 3.4 10*3/uL — ABNORMAL LOW (ref 3.8–10.8)

## 2021-06-14 LAB — COMPREHENSIVE METABOLIC PANEL
AG Ratio: 2 (calc) (ref 1.0–2.5)
ALT: 13 U/L (ref 6–29)
AST: 16 U/L (ref 10–35)
Albumin: 4.4 g/dL (ref 3.6–5.1)
Alkaline phosphatase (APISO): 56 U/L (ref 37–153)
BUN: 17 mg/dL (ref 7–25)
CO2: 26 mmol/L (ref 20–32)
Calcium: 9.1 mg/dL (ref 8.6–10.4)
Chloride: 106 mmol/L (ref 98–110)
Creat: 0.63 mg/dL (ref 0.50–1.03)
Globulin: 2.2 g/dL (calc) (ref 1.9–3.7)
Glucose, Bld: 82 mg/dL (ref 65–99)
Potassium: 4 mmol/L (ref 3.5–5.3)
Sodium: 142 mmol/L (ref 135–146)
Total Bilirubin: 1 mg/dL (ref 0.2–1.2)
Total Protein: 6.6 g/dL (ref 6.1–8.1)

## 2021-06-14 LAB — TSH: TSH: 0.75 mIU/L

## 2021-06-14 LAB — LIPID PANEL
Cholesterol: 204 mg/dL — ABNORMAL HIGH (ref <200)
HDL: 65 mg/dL (ref >=50)
LDL Cholesterol (Calc): 119 mg/dL (calc) — ABNORMAL HIGH
Non-HDL Cholesterol (Calc): 139 mg/dL (calc) — ABNORMAL HIGH (ref <130)
Total CHOL/HDL Ratio: 3.1 (calc) (ref <5.0)
Triglycerides: 98 mg/dL (ref <150)

## 2021-06-19 ENCOUNTER — Ambulatory Visit
Admission: RE | Admit: 2021-06-19 | Discharge: 2021-06-19 | Disposition: A | Discharge status: Home / Self Care | Payer: BC Managed Care – PPO | Source: Ambulatory Visit | Attending: Adult Health | Admitting: Adult Health

## 2021-06-19 ENCOUNTER — Other Ambulatory Visit: Payer: Self-pay

## 2021-06-19 DIAGNOSIS — Z1231 Encounter for screening mammogram for malignant neoplasm of breast: Secondary | ICD-10-CM

## 2021-07-17 ENCOUNTER — Ambulatory Visit (INDEPENDENT_AMBULATORY_CARE_PROVIDER_SITE_OTHER): Payer: BC Managed Care – PPO | Admitting: Gastroenterology

## 2021-07-17 ENCOUNTER — Encounter (INDEPENDENT_AMBULATORY_CARE_PROVIDER_SITE_OTHER): Payer: Self-pay | Admitting: Gastroenterology

## 2021-07-17 ENCOUNTER — Other Ambulatory Visit: Payer: Self-pay

## 2021-07-17 VITALS — BP 117/74 | HR 68 | Temp 97.9°F | Ht 63.0 in | Wt 137.3 lb

## 2021-07-17 DIAGNOSIS — K5190 Ulcerative colitis, unspecified, without complications: Secondary | ICD-10-CM | POA: Diagnosis not present

## 2021-07-17 MED ORDER — MESALAMINE 1.2 G PO TBEC: 2.4000 g | DELAYED_RELEASE_TABLET | Freq: Two times a day (BID) | ORAL | 5 refills | Status: DC

## 2021-07-17 NOTE — Progress Notes (Signed)
Referring Provider: Redmond School, MD Primary Care Physician:  Redmond School, MD Primary GI Physician: Rehman  Chief Complaint  Patient presents with   Follow-up    Patient here today for a follow up. She states she is not currently having any GI issues. She has 1-2 bm's per day, and her appetite is good.    HPI:   Sandra Crane is a 55 y.o. female with past medical history of arthritis, elevated cholesterol and UC.  Patient presenting today for Follow up of UC.  UC:dx June 2006, currently on oral mesalamine 2.4g BID, Dr. Laural Golden stopped her low dose 6-MP in Jan 2022 due to some abnormal labs. Last labs 06/13/21 showed mild leukopenia, cholesterol 204 and LDL 119. Other labs WNL, due for repeat CBC and lipid panel on 08/13/21 per Dr. Laural Golden.   She is having 1-2 BMs per day, she reports that stools are soft, denies diarrhea. Denies blood or black stools. Denies abdominal pain or cramping, no recent weight loss, appetite is good.  Last Colonoscopy:(08/08/20)- The examined portion of the ileum was normal.- The entire examined colon is normal.- External hemorrhoids. - No specimens collected. Last Endoscopy:n/a  Recommendations:  Surveillance colonoscopy 5 years  Past Medical History:  Diagnosis Date   Arthritis    Contraceptive management 09/12/2013   Elevated cholesterol    Ulcerative colitis     Past Surgical History:  Procedure Laterality Date   CESAREAN SECTION  [phone]   COLONOSCOPY     COLONOSCOPY N/A 10/24/2015   Procedure: COLONOSCOPY;  Surgeon: Rogene Houston, MD;  [facility];  Service: Endoscopy;  Laterality: N/A;  12:00   COLONOSCOPY N/A 08/08/2020   REhman: The examined portion of the ileum was normal.- The entire examined colon is normal.- External hemorrhoids. no specimens collected   FOOT SURGERY Right 02/21/2020   METATARSAL OSTEOTOMY Right 02/21/2020   Procedure: DISTAL METATARSAL OSTEOTOMY;  Surgeon: Tyson Babinski, DPM;   Location: [facility];  Service: Podiatry;  Laterality: Right;   REPAIR EXTENSOR TENDON Right 02/21/2020   Procedure: LENGTHENING OF EXTENSOR TENDON;  Surgeon: Tyson Babinski, DPM;  Location: [facility];  Service: Podiatry;  Laterality: Right;   root canal with crown     SIGMOIDOSCOPY  07/19/2009   TONSILLECTOMY AND ADENOIDECTOMY N/A 11/10/2016   Procedure: TONSILLECTOMY AND ADENOIDECTOMY;  Surgeon: Leta Baptist, MD;  Location: [place];  Service: ENT;  Laterality: N/A;   WISDOM TOOTH EXTRACTION      Current Outpatient Medications  Medication Sig Dispense Refill   Boswellia-Glucosamine-Vit D (OSTEO BI-FLEX ONE PER DAY PO) Take 1 tablet by mouth daily.      Multiple Vitamin (MULTIVITAMIN WITH MINERALS) TABS tablet Take 1 tablet by mouth daily. With Iron     progesterone (PROMETRIUM) 200 MG capsule TAKE 1 CAPSULE(200 MG) BY MOUTH DAILY 90 capsule 3   simvastatin (ZOCOR) 40 MG tablet Take 1 tablet (40 mg total) by mouth daily. 90 tablet 3   mesalamine (LIALDA) 1.2 g EC tablet Take 2 tablets (2.4 g total) by mouth 2 (two) times daily. 120 tablet 5   No current facility-administered medications for this visit.    Allergies as of 07/17/2021 - Review Complete 07/17/2021  Allergen Reaction Noted   Tramadol Other (See Comments) 11/21/2020    Family History  Problem Relation Age of Onset   Thyroid cancer Mother    Diabetes Mother    Cancer Mother    Diabetes Father    Hypertension Sister  Hyperlipidemia Sister    Diabetes Sister    Healthy Brother    Healthy Sister    Healthy Brother    Glaucoma Brother    Healthy Daughter    Healthy Daughter    Healthy Son     Social History   Socioeconomic History   Marital status: Married    Spouse name: Not on file   Number of children: 3   Years of education: Not on file   Highest education level: Not on file  Occupational History   Not on file  Tobacco Use   Smoking status: Never   Smokeless tobacco: Never  Vaping Use    Vaping Use: Never used  Substance and Sexual Activity   Alcohol use: Yes    Comment: occ   Drug use: No   Sexual activity: Yes    Birth control/protection: Post-menopausal  Other Topics Concern   Not on file  Social History Narrative   Not on file   Social Determinants of Health   Financial Resource Strain: Low Risk    Difficulty of Paying Living Expenses: Not hard at all  Food Insecurity: No Food Insecurity   Worried About Charity fundraiser in the Last Year: Never true   [place] in the Last Year: Never true  Transportation Needs: No Transportation Needs   Lack of Transportation (Medical): No   Lack of Transportation (Non-Medical): No  Physical Activity: Insufficiently Active   Days of Exercise per Week: 2 days   Minutes of Exercise per Session: 30 min  Stress: No Stress Concern Present   Feeling of Stress : Not at all  Social Connections: Moderately Isolated   Frequency of Communication with Friends and Family: More than three times a week   Frequency of Social Gatherings with Friends and Family: Twice a week   Attends Religious Services: Never   Marine scientist or Organizations: No   Attends Music therapist: Never   Marital Status: Married    Review of Systems: Gen: Denies fever, chills, anorexia. Denies fatigue, weakness, weight loss.  CV: Denies chest pain, palpitations, syncope, peripheral edema, and claudication. Resp: Denies dyspnea at rest, cough, wheezing, coughing up blood, and pleurisy. GI: Denies vomiting blood, jaundice, and fecal incontinence. Denies dysphagia or odynophagia. Derm: Denies rash, itching, dry skin Psych: Denies depression, anxiety, memory loss, confusion. No homicidal or suicidal ideation.  Heme: Denies bruising, bleeding, and enlarged lymph nodes.  Physical Exam: BP 117/74 (BP Location: Left Arm, Patient Position: Sitting, Cuff Size: Small)   Pulse 68   Temp 97.9 F (36.6 C) (Oral)   Ht 5' 3"  (1.6 m)   Wt  137 lb 4.8 oz (62.3 kg)   BMI 24.32 kg/m  General:   Alert and oriented. No distress noted. Pleasant and cooperative.  Head:  Normocephalic and atraumatic. Eyes:  Conjuctiva clear without scleral icterus. Mouth:  Oral mucosa pink and moist. Good dentition. No lesions. Heart: Normal rate and rhythm, s1 and s2 heart sounds present.  Lungs: Clear lung sounds in all lobes. Respirations equal and unlabored. Abdomen:  +BS, soft, non-tender and non-distended. No rebound or guarding. No HSM or masses noted. Derm: No palmar erythema or jaundice Msk:  Symmetrical without gross deformities. Normal posture. Extremities:  Without edema. Neurologic:  Alert and  oriented x4 Psych:  Alert and cooperative. Normal mood and affect.  Invalid input(s): 6 MONTHS   ASSESSMENT: Sandra Crane is a 55 y.o. female presenting today for follow  up of UC. She is currently on mesalamine 2.4g BID. She is doing well on this, last colonoscopy in 2021 showed no signs of inflammation.   Having 1-2 soft but formed BMs per day without abdominal pain, cramping, melena, hematochezia. Appetite is good. No recent weight loss. We will continue 2.4g mesalamine BID.  Patient to have lipid panel and CBC repeated Oct 5th as last labs showed mild leukopenia and elevated cholesterol. She is aware to be fasting for these labs.   PLAN:  Continue mesalamine 2.4g BID 2. Repeat labs oct 5th  Follow Up: 1 year  Ariston Grandison L. Alver Sorrow, MSN, APRN, AGNP-C Adult-Gerontology Nurse Practitioner [facility]

## 2021-07-17 NOTE — Patient Instructions (Addendum)
Continue mesalamine twice daily, refill sent to your pharmacy We will plan to see you again in 1 year, if you have any new symptoms prior to then, please give [place] a call.   Repeat labs on Oct 5th

## 2021-07-22 ENCOUNTER — Ambulatory Visit (INDEPENDENT_AMBULATORY_CARE_PROVIDER_SITE_OTHER): Payer: BC Managed Care – PPO | Admitting: Internal Medicine

## 2021-08-05 ENCOUNTER — Telehealth (INDEPENDENT_AMBULATORY_CARE_PROVIDER_SITE_OTHER): Payer: Self-pay | Admitting: *Deleted

## 2021-08-05 ENCOUNTER — Other Ambulatory Visit (INDEPENDENT_AMBULATORY_CARE_PROVIDER_SITE_OTHER): Payer: Self-pay | Admitting: *Deleted

## 2021-08-05 DIAGNOSIS — E785 Hyperlipidemia, unspecified: Secondary | ICD-10-CM

## 2021-08-05 DIAGNOSIS — D72819 Decreased white blood cell count, unspecified: Secondary | ICD-10-CM

## 2021-08-05 NOTE — Telephone Encounter (Signed)
Pt called and wanted bw orders put in for her to do October 5th. Orders put in and pt was notified to pick up orders. Darius Bump, this is in your reminder file so you can mark it done.

## 2021-08-06 NOTE — Telephone Encounter (Signed)
error 

## 2021-08-14 LAB — CBC WITH DIFFERENTIAL/PLATELET
Absolute Monocytes: 532 cells/uL (ref 200–950)
Basophils Absolute: 32 cells/uL (ref 0–200)
Basophils Relative: 0.8 %
Eosinophils Absolute: 40 cells/uL (ref 15–500)
Eosinophils Relative: 1 %
HCT: 40 % (ref 35.0–45.0)
Hemoglobin: 13.5 g/dL (ref 11.7–15.5)
Lymphs Abs: 900 cells/uL (ref 850–3900)
MCH: 32.6 pg (ref 27.0–33.0)
MCHC: 33.8 g/dL (ref 32.0–36.0)
MCV: 96.6 fL (ref 80.0–100.0)
MPV: 10 fL (ref 7.5–12.5)
Monocytes Relative: 13.3 %
Neutro Abs: 2496 cells/uL (ref 1500–7800)
Neutrophils Relative %: 62.4 %
Platelets: 202 10*3/uL (ref 140–400)
RBC: 4.14 10*6/uL (ref 3.80–5.10)
RDW: 12.5 % (ref 11.0–15.0)
Total Lymphocyte: 22.5 %
WBC: 4 10*3/uL (ref 3.8–10.8)

## 2021-08-14 LAB — LIPID PANEL
Cholesterol: 152 mg/dL (ref <200)
HDL: 57 mg/dL (ref >=50)
LDL Cholesterol (Calc): 78 mg/dL (calc)
Non-HDL Cholesterol (Calc): 95 mg/dL (calc) (ref <130)
Total CHOL/HDL Ratio: 2.7 (calc) (ref <5.0)
Triglycerides: 89 mg/dL (ref <150)

## 2021-10-16 ENCOUNTER — Other Ambulatory Visit: Payer: Self-pay | Admitting: Adult Health

## 2021-10-16 MED ORDER — SULFAMETHOXAZOLE-TRIMETHOPRIM 800-160 MG PO TABS: 1.0000 | ORAL_TABLET | Freq: Two times a day (BID) | ORAL | 0 refills | Status: DC

## 2021-10-16 NOTE — Progress Notes (Signed)
Rx septra ds

## 2021-11-24 ENCOUNTER — Ambulatory Visit (INDEPENDENT_AMBULATORY_CARE_PROVIDER_SITE_OTHER): Payer: BC Managed Care – PPO | Admitting: Adult Health

## 2021-11-24 ENCOUNTER — Other Ambulatory Visit: Payer: Self-pay

## 2021-11-24 ENCOUNTER — Encounter: Payer: Self-pay | Admitting: Adult Health

## 2021-11-24 VITALS — BP 123/67 | HR 66 | Ht 63.0 in | Wt 138.4 lb

## 2021-11-24 DIAGNOSIS — Z01419 Encounter for gynecological examination (general) (routine) without abnormal findings: Secondary | ICD-10-CM

## 2021-11-24 DIAGNOSIS — Z1211 Encounter for screening for malignant neoplasm of colon: Secondary | ICD-10-CM | POA: Diagnosis not present

## 2021-11-24 LAB — HEMOCCULT GUIAC POC 1CARD (OFFICE): Fecal Occult Blood, POC: NEGATIVE

## 2021-11-24 NOTE — Progress Notes (Signed)
Patient ID: Sandra Crane, female   DOB: 1966/08/28, 56 y.o.   MRN: [id number] History of Present Illness: Sandra Crane is a 56 year old white female,married, PM in for well woman gyn exam. She has retired. Lab Results  Component Value Date   DIAGPAP  11/21/2020    - Negative for intraepithelial lesion or malignancy (NILM)   HPV NOT DETECTED 10/03/2018   [place] Negative 11/21/2020    PCP is Dr Sandra Crane.  Current Medications, Allergies, Past Medical History, Past Surgical History, Family History and Social History were reviewed in [facility] record.     Review of Systems: Patient denies any headaches, hearing loss, fatigue, blurred vision, shortness of breath, chest pain, abdominal pain, problems with bowel movements, urination, or intercourse. No mood swings.  Has pain in hip, has OA. Denies any vaginal bleeding, taking Prometrium    Physical Exam:BP 123/67 (BP Location: Right Arm, Patient Position: Sitting, Cuff Size: Normal)    Pulse 66    Ht 5' 3"  (1.6 m)    Wt 138 lb 6.4 oz (62.8 kg)    BMI 24.52 kg/m   General:  Well developed, well nourished, no acute distress Skin:  Warm and dry Neck:  Midline trachea, normal thyroid, good ROM, no lymphadenopathy Lungs; Clear to auscultation bilaterally Breast:  No dominant palpable mass, retraction, or nipple discharge Cardiovascular: Regular rate and rhythm Abdomen:  Soft, non tender, no hepatosplenomegaly Pelvic:  External genitalia is normal in appearance, no lesions.  The vagina is normal in appearance. Urethra has no lesions or masses. The cervix is bulbous.  Uterus is felt to be normal size, shape, and contour.  No adnexal masses or tenderness noted.Bladder is non tender, no masses felt. Rectal: Good sphincter tone, no polyps, +hemorrhoids felt.  Hemoccult negative. Extremities/musculoskeletal:  No swelling or varicosities noted, no clubbing or cyanosis Psych:  No mood changes, alert and cooperative,seems  happy AA is 2 Fall risk is low Depression screen [facility] 2/9 11/24/2021 11/21/2020 11/14/2019  Decreased Interest 0 0 0  Down, Depressed, Hopeless 0 0 0  PHQ - 2 Score 0 0 0  Altered sleeping 0 0 -  Tired, decreased energy 0 0 -  Change in appetite 0 0 -  Feeling bad or failure about yourself  0 0 -  Trouble concentrating 0 0 -  Moving slowly or fidgety/restless 0 0 -  Suicidal thoughts 0 0 -  PHQ-9 Score 0 0 -    GAD 7 : Generalized Anxiety Score 11/24/2021 11/21/2020  Nervous, Anxious, on Edge 0 0  Control/stop worrying 0 0  Worry too much - different things 0 0  Trouble relaxing 0 0  Restless 0 0  Easily annoyed or irritable 0 0  Afraid - awful might happen 0 0  Total GAD 7 Score 0 0      [place] - 11/24/21 1118       Pregnancy Intention Screening   Does the patient want to become pregnant in the next year? No    Does the patient's partner want to become pregnant in the next year? No    Would the patient like to discuss contraceptive options today? No      Contraception Wrap Up   Current Method --   PM   End Method --   PM   Contraception Counseling Provided No             Examination chaperoned by Sandra Squibb LPN  Impression and plan: 1. Encounter for well  woman exam with routine gynecological exam Physical in 1 year Pap in 2025 Labs with Dr Laural Golden Mammogram yearly Colonoscopy per GI   2. Encounter for screening fecal occult blood testing

## 2021-12-26 ENCOUNTER — Other Ambulatory Visit: Payer: Self-pay | Admitting: Otolaryngology

## 2021-12-26 DIAGNOSIS — R221 Localized swelling, mass and lump, neck: Secondary | ICD-10-CM

## 2022-01-06 ENCOUNTER — Ambulatory Visit
Admission: RE | Admit: 2022-01-06 | Discharge: 2022-01-06 | Disposition: A | Discharge status: Home / Self Care | Payer: BC Managed Care – PPO | Source: Ambulatory Visit | Attending: Otolaryngology | Admitting: Otolaryngology

## 2022-01-06 DIAGNOSIS — R221 Localized swelling, mass and lump, neck: Secondary | ICD-10-CM

## 2022-01-06 MED ORDER — IOPAMIDOL (ISOVUE-300) INJECTION 61%
75.0000 mL | Freq: Once | INTRAVENOUS | Status: AC | PRN
  Administered 2022-01-06: 75 mL via INTRAVENOUS

## 2022-02-04 ENCOUNTER — Other Ambulatory Visit (INDEPENDENT_AMBULATORY_CARE_PROVIDER_SITE_OTHER): Payer: Self-pay | Admitting: *Deleted

## 2022-02-04 DIAGNOSIS — K5180 Other ulcerative colitis without complications: Secondary | ICD-10-CM

## 2022-02-13 LAB — CBC WITH DIFFERENTIAL/PLATELET
Absolute Monocytes: 508 cells/uL (ref 200–950)
Basophils Absolute: 21 cells/uL (ref 0–200)
Basophils Relative: 0.6 %
Eosinophils Absolute: 42 cells/uL (ref 15–500)
Eosinophils Relative: 1.2 %
HCT: 40 % (ref 35.0–45.0)
Hemoglobin: 13.1 g/dL (ref 11.7–15.5)
Lymphs Abs: 872 cells/uL (ref 850–3900)
MCH: 32.2 pg (ref 27.0–33.0)
MCHC: 32.8 g/dL (ref 32.0–36.0)
MCV: 98.3 fL (ref 80.0–100.0)
MPV: 9.6 fL (ref 7.5–12.5)
Monocytes Relative: 14.5 %
Neutro Abs: 2058 cells/uL (ref 1500–7800)
Neutrophils Relative %: 58.8 %
Platelets: 229 10*3/uL (ref 140–400)
RBC: 4.07 10*6/uL (ref 3.80–5.10)
RDW: 12.1 % (ref 11.0–15.0)
Total Lymphocyte: 24.9 %
WBC: 3.5 10*3/uL — ABNORMAL LOW (ref 3.8–10.8)

## 2022-02-13 LAB — HEPATIC FUNCTION PANEL
AG Ratio: 1.8 (calc) (ref 1.0–2.5)
ALT: 9 U/L (ref 6–29)
AST: 16 U/L (ref 10–35)
Albumin: 4.4 g/dL (ref 3.6–5.1)
Alkaline phosphatase (APISO): 61 U/L (ref 37–153)
Bilirubin, Direct: 0.1 mg/dL (ref 0.0–0.2)
Globulin: 2.4 g/dL (calc) (ref 1.9–3.7)
Indirect Bilirubin: 0.5 mg/dL (calc) (ref 0.2–1.2)
Total Bilirubin: 0.6 mg/dL (ref 0.2–1.2)
Total Protein: 6.8 g/dL (ref 6.1–8.1)

## 2022-02-16 ENCOUNTER — Other Ambulatory Visit (INDEPENDENT_AMBULATORY_CARE_PROVIDER_SITE_OTHER): Payer: Self-pay | Admitting: *Deleted

## 2022-02-16 DIAGNOSIS — D72819 Decreased white blood cell count, unspecified: Secondary | ICD-10-CM

## 2022-03-02 ENCOUNTER — Other Ambulatory Visit (INDEPENDENT_AMBULATORY_CARE_PROVIDER_SITE_OTHER): Payer: Self-pay | Admitting: Internal Medicine

## 2022-03-04 NOTE — Telephone Encounter (Signed)
Patient aware we will fill once, but she will have to get pcp to do in future. ?

## 2022-03-28 ENCOUNTER — Other Ambulatory Visit (INDEPENDENT_AMBULATORY_CARE_PROVIDER_SITE_OTHER): Payer: Self-pay | Admitting: Gastroenterology

## 2022-05-06 ENCOUNTER — Other Ambulatory Visit: Payer: Self-pay | Admitting: *Deleted

## 2022-05-06 ENCOUNTER — Other Ambulatory Visit: Payer: Self-pay | Admitting: Adult Health

## 2022-05-06 DIAGNOSIS — D72819 Decreased white blood cell count, unspecified: Secondary | ICD-10-CM

## 2022-05-06 DIAGNOSIS — Z1231 Encounter for screening mammogram for malignant neoplasm of breast: Secondary | ICD-10-CM

## 2022-05-15 ENCOUNTER — Telehealth (INDEPENDENT_AMBULATORY_CARE_PROVIDER_SITE_OTHER): Payer: Self-pay | Admitting: *Deleted

## 2022-05-15 NOTE — Telephone Encounter (Signed)
Patient in reminder file:  do CBC with differential and peripheral smear by pathologist in 3 months. Orders put in as future just release when due May 18, 2022 and call pt to let her know.    Pt called on 6/28 and asked about orders. I released orders and [facility] to [facility] and pt is aware to do around 05/18/22.

## 2022-05-19 LAB — CBC WITH DIFFERENTIAL/PLATELET
Absolute Monocytes: 532 cells/uL (ref 200–950)
Basophils Absolute: 39 cells/uL (ref 0–200)
Basophils Relative: 1.1 %
Eosinophils Absolute: 60 cells/uL (ref 15–500)
Eosinophils Relative: 1.7 %
HCT: 40.2 % (ref 35.0–45.0)
Hemoglobin: 13.3 g/dL (ref 11.7–15.5)
Lymphs Abs: 1022 cells/uL (ref 850–3900)
MCH: 32.3 pg (ref 27.0–33.0)
MCHC: 33.1 g/dL (ref 32.0–36.0)
MCV: 97.6 fL (ref 80.0–100.0)
MPV: 9.5 fL (ref 7.5–12.5)
Monocytes Relative: 15.2 %
Neutro Abs: 1848 cells/uL (ref 1500–7800)
Neutrophils Relative %: 52.8 %
Platelets: 242 10*3/uL (ref 140–400)
RBC: 4.12 10*6/uL (ref 3.80–5.10)
RDW: 12 % (ref 11.0–15.0)
Total Lymphocyte: 29.2 %
WBC: 3.5 10*3/uL — ABNORMAL LOW (ref 3.8–10.8)

## 2022-05-19 LAB — PATHOLOGIST SMEAR REVIEW

## 2022-06-22 ENCOUNTER — Ambulatory Visit
Admission: RE | Admit: 2022-06-22 | Discharge: 2022-06-22 | Disposition: A | Discharge status: Home / Self Care | Payer: BC Managed Care – PPO | Source: Ambulatory Visit | Attending: Adult Health | Admitting: Adult Health

## 2022-06-22 DIAGNOSIS — Z1231 Encounter for screening mammogram for malignant neoplasm of breast: Secondary | ICD-10-CM

## 2022-07-30 ENCOUNTER — Ambulatory Visit (INDEPENDENT_AMBULATORY_CARE_PROVIDER_SITE_OTHER): Payer: BC Managed Care – PPO | Admitting: Gastroenterology

## 2022-07-30 ENCOUNTER — Encounter (INDEPENDENT_AMBULATORY_CARE_PROVIDER_SITE_OTHER): Payer: Self-pay | Admitting: Gastroenterology

## 2022-07-30 VITALS — BP 120/84 | HR 70 | Temp 98.1°F | Ht 63.0 in | Wt 142.6 lb

## 2022-07-30 DIAGNOSIS — K5190 Ulcerative colitis, unspecified, without complications: Secondary | ICD-10-CM | POA: Diagnosis not present

## 2022-07-30 NOTE — Patient Instructions (Addendum)
I'm glad you are doing well We will continue your mesalamine 2.4 g twice daily We will recheck blood counts, liver function and kidney function in January   Follow up 1 year

## 2022-07-30 NOTE — Progress Notes (Signed)
Referring Provider: Redmond School, MD Primary Care Physician:  Redmond School, MD Primary GI Physician: Previously Rehman  Chief Complaint  Patient presents with   Follow-up    Patient here today for a yearly follow up on her UC. Patient denies any current issues she is taking mesalamine 1.2 g two tablets bid.   HPI:   Sandra Crane is a 56 y.o. female with past medical history of arthritis, elevated cholesterol and UC.  Patient presenting today for follow up of UC  dx June 2006, currently on oral mesalamine 2.4g BID, Dr. Laural Golden stopped her low dose 6-MP in Jan 2022 due to some abnormal labs. Last labs 06/13/21 showed mild leukopenia, cholesterol 204 and LDL 119. Other labs WNL, due for repeat CBC and lipid panel on 08/13/21 per Dr. Laural Golden.   Last seen 07/17/21, at that time, having 1-2 BMs per day with soft stools, no diarrhea, no rectal bleeding or melena. Continued on mesalamine 2.4g BID with updated labs recommended in October.  -Last CBC 05/18/22 with WBC 3.5, this was unchanged from previous labs, advised to continued with current medication, follow up with PCP for further evaluation of leukopenia -LFTs WNL in April 2023 -Last lipid panel 08/13/21 WNL   Present:  States that she is doing well, denies any abdominal pain or current GI issues. States her appetite is good.  No joint pain or skin lesions/rashes.  Having 1-2 soft BMs per day without issue. No red flag symptoms. Patient denies melena, hematochezia, nausea, vomiting, diarrhea, constipation, dysphagia, odyonophagia, early satiety or weight loss.     Last Colonoscopy:(08/08/20)- The examined portion of the ileum was normal.- The entire examined colon is normal.- External hemorrhoids. - No specimens collected. Last Endoscopy:n/a  Recommend repeat colonoscopy in 5 years  Past Medical History:  Diagnosis Date   Arthritis    Contraceptive management 09/12/2013   Elevated cholesterol    Ulcerative colitis      Past Surgical History:  Procedure Laterality Date   CESAREAN SECTION  [phone]   COLONOSCOPY     COLONOSCOPY N/A 10/24/2015   Procedure: COLONOSCOPY;  Surgeon: Rogene Houston, MD;  [facility];  Service: Endoscopy;  Laterality: N/A;  12:00   COLONOSCOPY N/A 08/08/2020   REhman: The examined portion of the ileum was normal.- The entire examined colon is normal.- External hemorrhoids. no specimens collected   FOOT SURGERY Right 02/21/2020   METATARSAL OSTEOTOMY Right 02/21/2020   Procedure: DISTAL METATARSAL OSTEOTOMY;  Surgeon: Tyson Babinski, DPM;  Location: [facility];  Service: Podiatry;  Laterality: Right;   REPAIR EXTENSOR TENDON Right 02/21/2020   Procedure: LENGTHENING OF EXTENSOR TENDON;  Surgeon: Tyson Babinski, DPM;  Location: [facility];  Service: Podiatry;  Laterality: Right;   root canal with crown     SIGMOIDOSCOPY  07/19/2009   TONSILLECTOMY AND ADENOIDECTOMY N/A 11/10/2016   Procedure: TONSILLECTOMY AND ADENOIDECTOMY;  Surgeon: Leta Baptist, MD;  Location: [facility];  Service: ENT;  Laterality: N/A;   WISDOM TOOTH EXTRACTION      Current Outpatient Medications  Medication Sig Dispense Refill   Boswellia-Glucosamine-Vit D (OSTEO BI-FLEX ONE PER DAY PO) Take 1 tablet by mouth daily.      mesalamine (LIALDA) 1.2 g EC tablet TAKE 2 TABLETS(2.4 GRAMS) BY MOUTH TWICE DAILY 120 tablet 5   Multiple Vitamin (MULTIVITAMIN WITH MINERALS) TABS tablet Take 1 tablet by mouth daily. With Iron     OVER THE COUNTER MEDICATION Vit D once per day  progesterone (PROMETRIUM) 200 MG capsule TAKE 1 CAPSULE(200 MG) BY MOUTH DAILY 90 capsule 3   simvastatin (ZOCOR) 40 MG tablet TAKE 1 TABLET(40 MG) BY MOUTH DAILY 90 tablet 0   No current facility-administered medications for this visit.    Allergies as of 07/30/2022 - Review Complete 07/30/2022  Allergen Reaction Noted   Tramadol Other (See Comments) 11/21/2020    Family History  Problem Relation  Age of Onset   Thyroid cancer Mother    Diabetes Mother    Cancer Mother    Diabetes Father    Hypertension Sister    Hyperlipidemia Sister    Diabetes Sister    Healthy Brother    Healthy Sister    Healthy Brother    Glaucoma Brother    Healthy Daughter    Healthy Daughter    Healthy Son     Social History   Socioeconomic History   Marital status: Married    Spouse name: Not on file   Number of children: 3   Years of education: Not on file   Highest education level: Not on file  Occupational History   Not on file  Tobacco Use   Smoking status: Never   Smokeless tobacco: Never  Vaping Use   Vaping Use: Never used  Substance and Sexual Activity   Alcohol use: Yes    Comment: occ   Drug use: No   Sexual activity: Yes    Birth control/protection: Post-menopausal  Other Topics Concern   Not on file  Social History Narrative   Not on file   Social Determinants of Health   Financial Resource Strain: Low Risk  (11/24/2021)   Overall Financial Resource Strain (CARDIA)    Difficulty of Paying Living Expenses: Not hard at all  Food Insecurity: No Food Insecurity (11/24/2021)   Hunger Vital Sign    Worried About Running Out of Food in the Last Year: Never true    [place] in the Last Year: Never true  Transportation Needs: No Transportation Needs (11/24/2021)   PRAPARE - Hydrologist (Medical): No    Lack of Transportation (Non-Medical): No  Physical Activity: Insufficiently Active (11/24/2021)   Exercise Vital Sign    Days of Exercise per Week: 3 days    Minutes of Exercise per Session: 40 min  Stress: No Stress Concern Present (11/24/2021)   [place]    Feeling of Stress : Not at all  Social Connections: Unknown (11/24/2021)   Social Connection and Isolation Panel [NHANES]    Frequency of Communication with Friends and Family: More than three times a week     Frequency of Social Gatherings with Friends and Family: Three times a week    Attends Religious Services: Patient refused    Active Member of Clubs or Organizations: Patient refused    Attends Archivist Meetings: Patient refused    Marital Status: Married    Review of systems General: negative for malaise, night sweats, fever, chills, weight los Neck: Negative for lumps, goiter, pain and significant neck swelling Resp: Negative for cough, wheezing, dyspnea at rest CV: Negative for chest pain, leg swelling, palpitations, orthopnea GI: denies melena, hematochezia, nausea, vomiting, diarrhea, constipation, dysphagia, odyonophagia, early satiety or unintentional weight loss.  MSK: Negative for joint pain or swelling, back pain, and muscle pain. Derm: Negative for itching or rash Psych: Denies depression, anxiety, memory loss, confusion. No homicidal or suicidal ideation.  Heme: Negative for prolonged bleeding, bruising easily, and swollen nodes. Endocrine: Negative for cold or heat intolerance, polyuria, polydipsia and goiter. Neuro: negative for tremor, gait imbalance, syncope and seizures. The remainder of the review of systems is noncontributory.  Physical Exam: BP 120/84 (BP Location: Left Arm, Patient Position: Sitting, Cuff Size: Small)   Pulse 70   Temp 98.1 F (36.7 C) (Oral)   Ht 5' 3"  (1.6 m)   Wt 142 lb 9.6 oz (64.7 kg)   BMI 25.26 kg/m  General:   Alert and oriented. No distress noted. Pleasant and cooperative.  Head:  Normocephalic and atraumatic. Eyes:  Conjuctiva clear without scleral icterus. Mouth:  Oral mucosa pink and moist. Good dentition. No lesions. Heart: Normal rate and rhythm, s1 and s2 heart sounds present.  Lungs: Clear lung sounds in all lobes. Respirations equal and unlabored. Abdomen:  +BS, soft, non-tender and non-distended. No rebound or guarding. No HSM or masses noted. Derm: No palmar erythema or jaundice Msk:  Symmetrical without gross  deformities. Normal posture. Extremities:  Without edema. Neurologic:  Alert and  oriented x4 Psych:  Alert and cooperative. Normal mood and affect.  Invalid input(s): "6 MONTHS"   ASSESSMENT: Sandra Crane is a 56 y.o. female presenting today for follow up of UC.  Patient has been maintained solely on Mesalamine 2.4g BID since January 2022 when 6-MP was stopped due to elevated cholesterol and Leukopenia. Colonoscopy in 2021 with endoscopic remission, recommended repeat in 5 years. Last CBC 05/18/22 with WBC 3.5, WBC has remained in 3-4 range for the past year. Will continue to monitor this as it is possible it is related to her mesalamine. She tells me PCP Is only checking her lipid panel. Last LFTs WNL in April 2023. Last lipid panel 08/13/21 WNL. Patient has no GI complaints. Having 1-2 BMs per day without rectal bleeding, melena, abdominal pain. No EIMs. Will recheck CBC and CMP in January 2024.    PLAN:  Continue mesalamine (LIALDA) 2.4g BID 2. CBC and CMP in January 2024  All questions were answered, patient verbalized understanding and is in agreement with plan as outlined above.   Follow Up: 1 year   Demarkis Gheen L. Alver Sorrow, MSN, APRN, AGNP-C Adult-Gerontology Nurse Practitioner [facility]

## 2022-08-12 ENCOUNTER — Inpatient Hospital Stay: Payer: BC Managed Care – PPO | Attending: Hematology | Admitting: Hematology

## 2022-08-12 ENCOUNTER — Encounter: Payer: Self-pay | Admitting: Hematology

## 2022-08-12 ENCOUNTER — Inpatient Hospital Stay: Payer: BC Managed Care – PPO

## 2022-08-12 VITALS — BP 118/74 | HR 84 | Temp 98.9°F | Resp 16 | Ht 63.0 in | Wt 142.4 lb

## 2022-08-12 DIAGNOSIS — Z808 Family history of malignant neoplasm of other organs or systems: Secondary | ICD-10-CM | POA: Diagnosis not present

## 2022-08-12 DIAGNOSIS — Z79899 Other long term (current) drug therapy: Secondary | ICD-10-CM | POA: Diagnosis not present

## 2022-08-12 DIAGNOSIS — D72819 Decreased white blood cell count, unspecified: Secondary | ICD-10-CM

## 2022-08-12 DIAGNOSIS — D709 Neutropenia, unspecified: Secondary | ICD-10-CM | POA: Insufficient documentation

## 2022-08-12 LAB — CBC WITH DIFFERENTIAL/PLATELET
Abs Immature Granulocytes: 0.01 10*3/uL (ref 0.00–0.07)
Basophils Absolute: 0 10*3/uL (ref 0.0–0.1)
Basophils Relative: 1 %
Eosinophils Absolute: 0 10*3/uL (ref 0.0–0.5)
Eosinophils Relative: 0 %
HCT: 41.9 % (ref 36.0–46.0)
Hemoglobin: 14 g/dL (ref 12.0–15.0)
Immature Granulocytes: 0 %
Lymphocytes Relative: 19 %
Lymphs Abs: 1 10*3/uL (ref 0.7–4.0)
MCH: 32.2 pg (ref 26.0–34.0)
MCHC: 33.4 g/dL (ref 30.0–36.0)
MCV: 96.3 fL (ref 80.0–100.0)
Monocytes Absolute: 0.6 10*3/uL (ref 0.1–1.0)
Monocytes Relative: 12 %
Neutro Abs: 3.5 10*3/uL (ref 1.7–7.7)
Neutrophils Relative %: 68 %
Platelets: 257 10*3/uL (ref 150–400)
RBC: 4.35 MIL/uL (ref 3.87–5.11)
RDW: 12.7 % (ref 11.5–15.5)
WBC: 5.2 10*3/uL (ref 4.0–10.5)
nRBC: 0 % (ref 0.0–0.2)

## 2022-08-12 LAB — HEPATITIS B CORE ANTIBODY, TOTAL: Hep B Core Total Ab: NONREACTIVE

## 2022-08-12 LAB — RETICULOCYTES
Immature Retic Fract: 12.5 % (ref 2.3–15.9)
RBC.: 4.45 MIL/uL (ref 3.87–5.11)
Retic Count, Absolute: 60.1 10*3/uL (ref 19.0–186.0)
Retic Ct Pct: 1.4 % (ref 0.4–3.1)

## 2022-08-12 LAB — HEPATITIS B SURFACE ANTIBODY,QUALITATIVE: Hep B S Ab: NONREACTIVE

## 2022-08-12 LAB — LACTATE DEHYDROGENASE: LDH: 138 U/L (ref 98–192)

## 2022-08-12 LAB — HEPATITIS C ANTIBODY: HCV Ab: NONREACTIVE

## 2022-08-12 NOTE — Patient Instructions (Addendum)
[facility]  Discharge Instructions  You were seen and examined today by Dr. Delton Coombes. Dr. Delton Coombes is a hematologist, meaning that he specializes in blood abnormalities. Dr. Delton Coombes discussed your past medical history, family history of cancers/blood conditions and the events that led to you being here today.  You were referred to Dr. Delton Coombes by Dr. Gerarda Fraction for Leukopenia. This simply means that your white blood cell count is lower than normal. For example, when Dr. Gerarda Fraction checked your labs on September 30th, your white blood cells were 2.8; however the normal range is 3.4 to 10.8.  Dr. Delton Coombes has recommended additional labs today for further evaluation. Please follow-up as scheduled to discuss the results in detail.  Thank you for choosing [facility] to provide your oncology and hematology care.   To afford each patient quality time with our provider, please arrive at least 15 minutes before your scheduled appointment time. You may need to reschedule your appointment if you arrive late (10 or more minutes). Arriving late affects you and other patients whose appointments are after yours.  Also, if you miss three or more appointments without notifying the office, you may be dismissed from the clinic at the provider's discretion.    Again, thank you for choosing [facility].  Our hope is that these requests will decrease the amount of time that you wait before being seen by our physicians.   If you have a lab appointment with the [facility] please come in thru the Main Entrance and check in at the main information desk.           _____________________________________________________________  Should you have questions after your visit to [facility], please contact our office at [phone] and follow the prompts.  Our office hours are 8:00 a.m. to 4:30 p.m. Monday - Thursday and 8:00 a.m. to 2:30  p.m. Friday.  Please note that voicemails left after 4:00 p.m. may not be returned until the following business day.  We are closed weekends and all major holidays.  You do have access to a nurse 24-7, just call the main number to the clinic [phone] and do not press any options, hold on the line and a nurse will answer the phone.    For prescription refill requests, have your pharmacy contact our office and allow 72 hours.    Masks are optional in the cancer centers. If you would like for your care team to wear a mask while they are taking care of you, please let them know. You may have one support person who is at least 57 years old accompany you for your appointments.

## 2022-08-12 NOTE — Progress Notes (Signed)
CONSULT NOTE  Patient Care Team: Redmond School, MD as PCP - General (Internal Medicine) Derek Jack, MD as Medical Oncologist (Medical Oncology)  CHIEF COMPLAINTS/PURPOSE OF CONSULTATION:  Leukopenia  HISTORY OF PRESENTING ILLNESS:  Sandra Crane 56 y.o. female is seen in consultation today at the request of Dr. Gerarda Fraction for further work-up and management of leukopenia.  CBC on 08/08/2022 showed white count to be low at 2.8.  Absolute neutrophil count was 1.5 and absolute lymphocyte count was 0.8.  Hemoglobin was 13.4 and platelet count was normal at 233.  Review of labs over several years shows she has intermittent leukopenia since 2013.  She reports that she has ulcerative colitis for more than 10 years.  She has used Lialda, Remicade and 6-MP in the past.  She is currently on mesalamine with stable symptoms.  Denies any fevers, night sweats or weight loss in the last 6 months.  Denies any recurrent infections.  No prior history of blood transfusions.  No new medications.  She lives at home with her husband.  She is a retired Optometrist.  Non-smoker.  No family history of leukemias.  Mother had thyroid cancer.    MEDICAL HISTORY:  Past Medical History:  Diagnosis Date   Arthritis    Contraceptive management 09/12/2013   Elevated cholesterol    Ulcerative colitis     SURGICAL HISTORY: Past Surgical History:  Procedure Laterality Date   CESAREAN SECTION  [phone]   COLONOSCOPY     COLONOSCOPY N/A 10/24/2015   Procedure: COLONOSCOPY;  Surgeon: Rogene Houston, MD;  [facility];  Service: Endoscopy;  Laterality: N/A;  12:00   COLONOSCOPY N/A 08/08/2020   REhman: The examined portion of the ileum was normal.- The entire examined colon is normal.- External hemorrhoids. no specimens collected   FOOT SURGERY Right 02/21/2020   METATARSAL OSTEOTOMY Right 02/21/2020   Procedure: DISTAL METATARSAL OSTEOTOMY;  Surgeon: Tyson Babinski, DPM;   Location[facility];  Service: Podiatry;  Laterality: Right;   REPAIR EXTENSOR TENDON Right 02/21/2020   Procedure: LENGTHENING OF EXTENSOR TENDON;  Surgeon: Tyson Babinski, DPM;  Location[facility];  Service: Podiatry;  Laterality: Right;   root canal with crown     SIGMOIDOSCOPY  07/19/2009   TONSILLECTOMY AND ADENOIDECTOMY N/A 11/10/2016   Procedure: TONSILLECTOMY AND ADENOIDECTOMY;  Surgeon: Leta Baptist, MD;  Location: [facility];  Service: ENT;  Laterality: N/A;   WISDOM TOOTH EXTRACTION      SOCIAL HISTORY: Social History   Socioeconomic History   Marital status: Married    Spouse name: Not on file   Number of children: 3   Years of education: Not on file   Highest education level: Not on file  Occupational History   Not on file  Tobacco Use   Smoking status: Never   Smokeless tobacco: Never  Vaping Use   Vaping Use: Never used  Substance and Sexual Activity   Alcohol use: Yes    Comment: occ   Drug use: No   Sexual activity: Yes    Birth control/protection: Post-menopausal  Other Topics Concern   Not on file  Social History Narrative   Not on file   Social Determinants of Health   Financial Resource Strain: Low Risk  (11/24/2021)   Overall Financial Resource Strain (CARDIA)    Difficulty of Paying Living Expenses: Not hard at all  Food Insecurity: No Food Insecurity (11/24/2021)   Hunger Vital Sign    Worried About Running  Out of Food in the Last Year: Never true    Ran Out of Food in the Last Year: Never true  Transportation Needs: No Transportation Needs (11/24/2021)   PRAPARE - Hydrologist (Medical): No    Lack of Transportation (Non-Medical): No  Physical Activity: Insufficiently Active (11/24/2021)   Exercise Vital Sign    Days of Exercise per Week: 3 days    Minutes of Exercise per Session: 40 min  Stress: No Stress Concern Present (11/24/2021)   [place]    Feeling of Stress : Not at all  Social Connections: Unknown (11/24/2021)   Social Connection and Isolation Panel [NHANES]    Frequency of Communication with Friends and Family: More than three times a week    Frequency of Social Gatherings with Friends and Family: Three times a week    Attends Religious Services: Patient refused    Active Member of Clubs or Organizations: Patient refused    Attends Archivist Meetings: Patient refused    Marital Status: Married  Human resources officer Violence: Not At Risk (11/24/2021)   Humiliation, Afraid, Rape, and Kick questionnaire    Fear of Current or Ex-Partner: No    Emotionally Abused: No    Physically Abused: No    Sexually Abused: No    FAMILY HISTORY: Family History  Problem Relation Age of Onset   Thyroid cancer Mother    Diabetes Mother    Cancer Mother    Diabetes Father    Hypertension Sister    Hyperlipidemia Sister    Diabetes Sister    Healthy Brother    Healthy Sister    Healthy Brother    Glaucoma Brother    Healthy Daughter    Healthy Daughter    Healthy Son     ALLERGIES:  is allergic to tramadol.  MEDICATIONS:  Current Outpatient Medications  Medication Sig Dispense Refill   Boswellia-Glucosamine-Vit D (OSTEO BI-FLEX ONE PER DAY PO) Take 1 tablet by mouth daily.      mesalamine (LIALDA) 1.2 g EC tablet TAKE 2 TABLETS(2.4 GRAMS) BY MOUTH TWICE DAILY 120 tablet 5   Multiple Vitamin (MULTIVITAMIN WITH MINERALS) TABS tablet Take 1 tablet by mouth daily. With Iron     OVER THE COUNTER MEDICATION Vit D once per day     progesterone (PROMETRIUM) 200 MG capsule TAKE 1 CAPSULE(200 MG) BY MOUTH DAILY 90 capsule 3   simvastatin (ZOCOR) 40 MG tablet TAKE 1 TABLET(40 MG) BY MOUTH DAILY 90 tablet 0   No current facility-administered medications for this visit.    REVIEW OF SYSTEMS:   Constitutional: Denies fevers, chills or abnormal night sweats Eyes: Denies blurriness of vision, double vision or  watery eyes Ears, nose, mouth, throat, and face: Denies mucositis or sore throat Respiratory: Denies cough, dyspnea or wheezes Cardiovascular: Denies palpitation, chest discomfort or lower extremity swelling Gastrointestinal:  Denies nausea, heartburn or change in bowel habits Skin: Denies abnormal skin rashes Lymphatics: Denies new lymphadenopathy or easy bruising Neurological:Denies numbness, tingling or new weaknesses Behavioral/Psych: Mood is stable, no new changes  All other systems were reviewed with the patient and are negative.  PHYSICAL EXAMINATION: ECOG PERFORMANCE STATUS: 0 - Asymptomatic  Vitals:   08/12/22 1346  BP: 118/74  Pulse: 84  Resp: 16  Temp: 98.9 F (37.2 C)  SpO2: 100%   Filed Weights   08/12/22 1346  Weight: 142 lb 6.4 oz (64.6 kg)  GENERAL:alert, no distress and comfortable SKIN: skin color, texture, turgor are normal, no rashes or significant lesions EYES: normal, conjunctiva are pink and non-injected, sclera clear OROPHARYNX:no exudate, no erythema and lips, buccal mucosa, and tongue normal  NECK: supple, thyroid normal size, non-tender, without nodularity LYMPH:  no palpable lymphadenopathy in the cervical, axillary or inguinal LUNGS: clear to auscultation and percussion with normal breathing effort HEART: regular rate & rhythm and no murmurs and no lower extremity edema ABDOMEN:abdomen soft, non-tender and normal bowel sounds Musculoskeletal:no cyanosis of digits and no clubbing  PSYCH: alert & oriented x 3 with fluent speech NEURO: no focal motor/sensory deficits  LABORATORY DATA:  I have reviewed the data as listed Recent Results (from the past 2160 hour(s))  CBC with Differential     Status: Abnormal   Collection Time: 05/18/22  9:07 AM  Result Value Ref Range   WBC 3.5 (L) 3.8 - 10.8 Thousand/uL   RBC 4.12 3.80 - 5.10 Million/uL   Hemoglobin 13.3 11.7 - 15.5 g/dL   HCT 40.2 35.0 - 45.0 %   MCV 97.6 80.0 - 100.0 fL   MCH 32.3 27.0  - 33.0 pg   MCHC 33.1 32.0 - 36.0 g/dL   RDW 12.0 11.0 - 15.0 %   Platelets 242 140 - 400 Thousand/uL   MPV 9.5 7.5 - 12.5 fL   Neutro Abs 1,848 1,500 - 7,800 cells/uL   Lymphs Abs 1,022 850 - 3,900 cells/uL   Absolute Monocytes 532 200 - 950 cells/uL   Eosinophils Absolute 60 15 - 500 cells/uL   Basophils Absolute 39 0 - 200 cells/uL   Neutrophils Relative % 52.8 %   Total Lymphocyte 29.2 %   Monocytes Relative 15.2 %   Eosinophils Relative 1.7 %   Basophils Relative 1.1 %  Pathologist smear review     Status: None   Collection Time: 05/18/22  9:07 AM  Result Value Ref Range   Path Review      Comment: Leukopenia. Myeloid population consists predominantly of mature segmented neutrophils with mild reactive changes. Review of the peripheral smear reveals adequate numbers of platelets. Elliptocytes noted. Reviewed by Francis Gaines Mammarappallil, MD  (Electronic Signature on File)     05/19/2022   Lactate dehydrogenase     Status: None   Collection Time: 08/12/22  2:41 PM  Result Value Ref Range   LDH 138 98 - 192 U/L    Comment: Performed at [facility], [address] [place], [place] [address]  Reticulocytes     Status: None   Collection Time: 08/12/22  2:41 PM  Result Value Ref Range   Retic Ct Pct 1.4 0.4 - 3.1 %   RBC. 4.45 3.87 - 5.11 MIL/uL   Retic Count, Absolute 60.1 19.0 - 186.0 K/uL   Immature Retic Fract 12.5 2.3 - 15.9 %    Comment: Performed at [facility], [address] [place], [place] [address]  CBC with Differential     Status: None   Collection Time: 08/12/22  2:41 PM  Result Value Ref Range   WBC 5.2 4.0 - 10.5 K/uL   RBC 4.35 3.87 - 5.11 MIL/uL   Hemoglobin 14.0 12.0 - 15.0 g/dL   HCT 41.9 36.0 - 46.0 %   MCV 96.3 80.0 - 100.0 fL   MCH 32.2 26.0 - 34.0 pg   MCHC 33.4 30.0 - 36.0 g/dL   RDW 12.7 11.5 - 15.5 %   Platelets 257 150 - 400 K/uL   nRBC  0.0 0.0 - 0.2 %   Neutrophils Relative % 68 %   Neutro Abs 3.5 1.7 - 7.7 K/uL   Lymphocytes Relative  19 %   Lymphs Abs 1.0 0.7 - 4.0 K/uL   Monocytes Relative 12 %   Monocytes Absolute 0.6 0.1 - 1.0 K/uL   Eosinophils Relative 0 %   Eosinophils Absolute 0.0 0.0 - 0.5 K/uL   Basophils Relative 1 %   Basophils Absolute 0.0 0.0 - 0.1 K/uL   Immature Granulocytes 0 %   Abs Immature Granulocytes 0.01 0.00 - 0.07 K/uL    Comment: Performed at [facility], [address] [place], [place] [address]    RADIOGRAPHIC STUDIES: I have personally reviewed the radiological images as listed and agreed with the findings in the report. No results found.  ASSESSMENT:  1.  Chronic intermittent leukopenia: - Patient seen at the request of Dr. Gerarda Fraction for leukopenia. - CBC (08/08/2022): WBC 2.8, differential 54% neutrophils, 27% lymphocytes, 16% monocytes, 2% eosinophils.  ANC-1.5, ALC-0.8.  Hb and PLT normal. - V85 501, folic acid normal. - No B symptoms.  No recurrent infections.  No prior history of transfusion. - Review of labs show patient has intermittent leukopenia for the last 5 to 10 years. - She has a history of ulcerative colitis for more than 10 years and is currently on mesalamine.  Previously used drugs include Lialda, Remicade and 6-MP.  2.  Social/family history: - She lives at home with her husband.  She is a retired Control and instrumentation engineer.  Non-smoker. - No family history of leukemia.  Mother had thyroid cancer.  Paternal uncle died of questionable head and neck cancer.  PLAN:  1.  Chronic leukopenia with mild neutropenia: - We discussed the differential diagnosis of low white count. - Most likely benign leukopenia as it has been intermittently low for the last decade.  We will repeat her count today.  We will also check for MMA and copper.  We will check for connective tissue disorders and infectious etiologies. - RTC next week for follow-up. - If there is no abnormality in the bowel labs, we will monitor her CBC once every 6 to 12 months.  Will consider further testing in the form of  flow cytometry and/or bone marrow biopsy if there is any significant changes from her baseline.   All questions were answered. The patient knows to call the clinic with any problems, questions or concerns.      Derek Jack, MD 08/12/22 4:33 PM

## 2022-08-13 LAB — HEPATITIS B SURFACE ANTIGEN: Hepatitis B Surface Ag: NONREACTIVE

## 2022-08-13 LAB — RHEUMATOID FACTOR: Rheumatoid fact SerPl-aCnc: <10 IU/mL (ref <14.0)

## 2022-08-13 LAB — ANTINUCLEAR ANTIBODIES, IFA: ANA Ab, IFA: NEGATIVE

## 2022-08-14 LAB — PROTEIN ELECTROPHORESIS, SERUM
A/G Ratio: 1.5 (ref 0.7–1.7)
Albumin ELP: 4 g/dL (ref 2.9–4.4)
Alpha-1-Globulin: 0.2 g/dL (ref 0.0–0.4)
Alpha-2-Globulin: 0.6 g/dL (ref 0.4–1.0)
Beta Globulin: 0.9 g/dL (ref 0.7–1.3)
Gamma Globulin: 0.9 g/dL (ref 0.4–1.8)
Globulin, Total: 2.7 g/dL (ref 2.2–3.9)
Total Protein ELP: 6.7 g/dL (ref 6.0–8.5)

## 2022-08-14 LAB — METHYLMALONIC ACID, SERUM: Methylmalonic Acid, Quantitative: 120 nmol/L (ref 0–378)

## 2022-08-18 NOTE — Progress Notes (Signed)
Sandra Crane, Sandra Crane   CLINIC:  Medical Oncology/Hematology  PCP:  [facility], [place] [address] [phone]   REASON FOR VISIT:  Follow-up for leukopenia  PRIOR THERAPY: None  CURRENT THERAPY: Under work-up  INTERVAL HISTORY:  Sandra Crane 56 y.o. female returns for routine follow-up of her intermittent leukopenia.  She was seen for nodule consultation by Dr. Delton Coombes on 08/12/2022.  At today's visit, she reports feeling well.  She denies any changes in her baseline health status since her initial visit with Dr. Delton Coombes last week.  She continues to deny any recurrent infections or new medications. No fever, night sweats, unexpected weight loss.  She has 100% energy and 100% appetite. She endorses that she is maintaining a stable weight.   REVIEW OF SYSTEMS:  Review of Systems  Constitutional:  Negative for appetite change, chills, diaphoresis, fatigue, fever and unexpected weight change.  HENT:   Negative for lump/mass and nosebleeds.   Eyes:  Negative for eye problems.  Respiratory:  Negative for cough, hemoptysis and shortness of breath.   Cardiovascular:  Negative for chest pain, leg swelling and palpitations.  Gastrointestinal:  Negative for abdominal pain, blood in stool, constipation, diarrhea, nausea and vomiting.  Genitourinary:  Negative for hematuria.   Skin: Negative.   Neurological:  Negative for dizziness, headaches and light-headedness.  Hematological:  Does not bruise/bleed easily.      PAST MEDICAL/SURGICAL HISTORY:  Past Medical History:  Diagnosis Date   Arthritis    Contraceptive management 09/12/2013   Elevated cholesterol    Ulcerative colitis    Past Surgical History:  Procedure Laterality Date   CESAREAN SECTION  [phone]   COLONOSCOPY     COLONOSCOPY N/A 10/24/2015   Procedure: COLONOSCOPY;  Surgeon: Rogene Houston, MD;  [facility];   Service: Endoscopy;  Laterality: N/A;  12:00   COLONOSCOPY N/A 08/08/2020   REhman: The examined portion of the ileum was normal.- The entire examined colon is normal.- External hemorrhoids. no specimens collected   FOOT SURGERY Right 02/21/2020   METATARSAL OSTEOTOMY Right 02/21/2020   Procedure: DISTAL METATARSAL OSTEOTOMY;  Surgeon: Tyson Babinski, DPM;  Location: [facility];  Service: Podiatry;  Laterality: Right;   REPAIR EXTENSOR TENDON Right 02/21/2020   Procedure: LENGTHENING OF EXTENSOR TENDON;  Surgeon: Tyson Babinski, DPM;  Location: [facility];  Service: Podiatry;  Laterality: Right;   root canal with crown     SIGMOIDOSCOPY  07/19/2009   TONSILLECTOMY AND ADENOIDECTOMY N/A 11/10/2016   Procedure: TONSILLECTOMY AND ADENOIDECTOMY;  Surgeon: Leta Baptist, MD;  Location: [facility];  Service: ENT;  Laterality: N/A;   WISDOM TOOTH EXTRACTION       SOCIAL HISTORY:  Social History   Socioeconomic History   Marital status: Married    Spouse name: Not on file   Number of children: 3   Years of education: Not on file   Highest education level: Not on file  Occupational History   Not on file  Tobacco Use   Smoking status: Never   Smokeless tobacco: Never  Vaping Use   Vaping Use: Never used  Substance and Sexual Activity   Alcohol use: Yes    Comment: occ   Drug use: No   Sexual activity: Yes    Birth control/protection: Post-menopausal  Other Topics Concern   Not on file  Social History Narrative   Not on file   Social Determinants of  Health   Financial Resource Strain: Low Risk  (11/24/2021)   Overall Financial Resource Strain (CARDIA)    Difficulty of Paying Living Expenses: Not hard at all  Food Insecurity: No Food Insecurity (11/24/2021)   Hunger Vital Sign    Worried About Running Out of Food in the Last Year: Never true    Ran Out of Food in the Last Year: Never true  Transportation Needs: No Transportation Needs (11/24/2021)   PRAPARE -  Hydrologist (Medical): No    Lack of Transportation (Non-Medical): No  Physical Activity: Insufficiently Active (11/24/2021)   Exercise Vital Sign    Days of Exercise per Week: 3 days    Minutes of Exercise per Session: 40 min  Stress: No Stress Concern Present (11/24/2021)   [place]    Feeling of Stress : Not at all  Social Connections: Unknown (11/24/2021)   Social Connection and Isolation Panel [NHANES]    Frequency of Communication with Friends and Family: More than three times a week    Frequency of Social Gatherings with Friends and Family: Three times a week    Attends Religious Services: Patient refused    Active Member of Clubs or Organizations: Patient refused    Attends Archivist Meetings: Patient refused    Marital Status: Married  Human resources officer Violence: Not At Risk (11/24/2021)   Humiliation, Afraid, Rape, and Kick questionnaire    Fear of Current or Ex-Partner: No    Emotionally Abused: No    Physically Abused: No    Sexually Abused: No    FAMILY HISTORY:  Family History  Problem Relation Age of Onset   Thyroid cancer Mother    Diabetes Mother    Cancer Mother    Diabetes Father    Hypertension Sister    Hyperlipidemia Sister    Diabetes Sister    Healthy Brother    Healthy Sister    Healthy Brother    Glaucoma Brother    Healthy Daughter    Healthy Daughter    Healthy Son     CURRENT MEDICATIONS:  Outpatient Encounter Medications as of 08/19/2022  Medication Sig Note   Boswellia-Glucosamine-Vit D (OSTEO BI-FLEX ONE PER DAY PO) Take 1 tablet by mouth daily.     mesalamine (LIALDA) 1.2 g EC tablet TAKE 2 TABLETS(2.4 GRAMS) BY MOUTH TWICE DAILY    Multiple Vitamin (MULTIVITAMIN WITH MINERALS) TABS tablet Take 1 tablet by mouth daily. With Iron    OVER THE COUNTER MEDICATION Vit D once per day    progesterone (PROMETRIUM) 200 MG capsule TAKE 1  CAPSULE(200 MG) BY MOUTH DAILY 07/17/2021: Per patient first 10 days of the month.   simvastatin (ZOCOR) 40 MG tablet TAKE 1 TABLET(40 MG) BY MOUTH DAILY    No facility-administered encounter medications on file as of 08/19/2022.    ALLERGIES:  Allergies  Allergen Reactions   Tramadol Other (See Comments)    Insomnia     PHYSICAL EXAM:  ECOG PERFORMANCE STATUS: 0 - Asymptomatic  There were no vitals filed for this visit. There were no vitals filed for this visit. Physical Exam Constitutional:      Appearance: Normal appearance.  HENT:     Head: Normocephalic and atraumatic.     Mouth/Throat:     Mouth: Mucous membranes are moist.  Eyes:     Extraocular Movements: Extraocular movements intact.     Pupils: Pupils are equal, round, and  reactive to light.  Cardiovascular:     Rate and Rhythm: Normal rate and regular rhythm.     Pulses: Normal pulses.     Heart sounds: Normal heart sounds.  Pulmonary:     Effort: Pulmonary effort is normal.     Breath sounds: Normal breath sounds.  Abdominal:     General: Bowel sounds are normal.     Palpations: Abdomen is soft.     Tenderness: There is no abdominal tenderness.  Musculoskeletal:        General: No swelling.     Right lower leg: No edema.     Left lower leg: No edema.  Lymphadenopathy:     Cervical: No cervical adenopathy.  Skin:    General: Skin is warm and dry.  Neurological:     General: No focal deficit present.     Mental Status: She is alert and oriented to person, place, and time.  Psychiatric:        Mood and Affect: Mood normal.        Behavior: Behavior normal.      LABORATORY DATA:  I have reviewed the labs as listed.  CBC    Component Value Date/Time   WBC 5.2 08/12/2022 1441   RBC 4.45 08/12/2022 1441   RBC 4.35 08/12/2022 1441   HGB 14.0 08/12/2022 1441   HCT 41.9 08/12/2022 1441   PLT 257 08/12/2022 1441   MCV 96.3 08/12/2022 1441   MCH 32.2 08/12/2022 1441   MCHC 33.4 08/12/2022 1441    RDW 12.7 08/12/2022 1441   LYMPHSABS 1.0 08/12/2022 1441   MONOABS 0.6 08/12/2022 1441   EOSABS 0.0 08/12/2022 1441   BASOSABS 0.0 08/12/2022 1441      Latest Ref Rng & Units 02/12/2022    8:19 AM 06/13/2021    8:22 AM 02/20/2021    7:55 AM  CMP  Glucose 65 - 99 mg/dL  82    BUN 7 - 25 mg/dL  17    Creatinine 0.50 - 1.03 mg/dL  0.63    Sodium 135 - 146 mmol/L  142    Potassium 3.5 - 5.3 mmol/L  4.0    Chloride 98 - 110 mmol/L  106    CO2 20 - 32 mmol/L  26    Calcium 8.6 - 10.4 mg/dL  9.1    Total Protein 6.1 - 8.1 g/dL 6.8  6.6  6.7   Total Bilirubin 0.2 - 1.2 mg/dL 0.6  1.0  0.5   AST 10 - 35 U/L _0 ALT 6 - 29 U/L _1 DIAGNOSTIC IMAGING:  I have independently reviewed the relevant imaging and discussed with the patient.  ASSESSMENT & PLAN: 1.  Chronic intermittent leukopenia with mild neutropenia - Patient seen at the request of Dr. Gerarda Fraction for leukopenia. - CBC (08/08/2022): WBC 2.8, differential 54% neutrophils, 27% lymphocytes, 16% monocytes, 2% eosinophils.  ANC-1.5, ALC-0.8.  Hb and PLT normal. - M46 803, folic acid normal. - No B symptoms.  No recurrent infections.  No prior history of transfusion. - Review of labs show patient has intermittent leukopenia for the last 5 to 10 years. - She has a history of ulcerative colitis for more than 10 years and is currently on mesalamine since approximately 2012.  Previously used drugs include Lialda, Remicade and 6-MP. - Hematology work-up (08/12/2022) was essentially normal.  Negative hepatitis B/hepatitis C.  Negative RF/ANA.  Normal copper and  MMA.  SPEP negative.  Normal LDH and reticulocytes. - Most recent CBC (08/12/2022): WBC normal at 5.2 with normal differential - PLAN: No abnormality in labs, benign leukopenia favored.  Likely reactive in the setting of chronic inflammation or secondary to mesalamine. - Continue follow-up with GI, who are checking blood counts every 3 to 6 months and adjusting mesalamine as  needed - Repeat CBC with office visit in 1 year.  Consider discharge from clinic at that time. - We will consider further testing in the form of flow cytometry and/or bone marrow biopsy if there is any significant change from her baseline.  2.  Social/family history: - She lives at home with her husband.  She is a retired Control and instrumentation engineer.  Non-smoker. - No family history of leukemia.  Mother had thyroid cancer.  Paternal uncle died of questionable head and neck cancer.    All questions were answered. The patient knows to call the clinic with any problems, questions or concerns.  Medical decision making: Low  Time spent on visit: I spent 15 minutes counseling the patient face to face. The total time spent in the appointment was 22 minutes and more than 50% was on counseling.   Harriett Rush, PA-C  08/19/2022 8:36 AM

## 2022-08-19 ENCOUNTER — Inpatient Hospital Stay (HOSPITAL_BASED_OUTPATIENT_CLINIC_OR_DEPARTMENT_OTHER): Payer: BC Managed Care – PPO | Admitting: Physician Assistant

## 2022-08-19 VITALS — BP 114/78 | HR 70 | Temp 98.1°F | Resp 16 | Ht 63.0 in | Wt 142.6 lb

## 2022-08-19 DIAGNOSIS — D72819 Decreased white blood cell count, unspecified: Secondary | ICD-10-CM

## 2022-08-19 DIAGNOSIS — D709 Neutropenia, unspecified: Secondary | ICD-10-CM | POA: Diagnosis not present

## 2022-08-19 NOTE — Patient Instructions (Signed)
[facility] **   You were seen today by Tarri Abernethy PA-C for your low white blood cells.   Your labs did not show any obvious cause of your low white blood cells, and your most recent white blood cells were within the normal range. You have had intermittent low white blood cells since at least 2012. This may be related to your mesalamine medication, and your blood counts should continue to be monitored by your gastroenterology team and your medication adjusted as needed. We will check your labs and see you for follow-up visit in 1 year.   ** Thank you for trusting me with your healthcare!  I strive to provide all of my patients with quality care at each visit.  If you receive a survey for this visit, I would be so grateful to you for taking the time to provide feedback.  Thank you in advance!  ~ Zyaire Mccleod                   Dr. Derek Jack   &   Tarri Abernethy, PA-C   - - - - - - - - - - - - - - - - - -    Thank you for choosing [facility] to provide your oncology and hematology care.  To afford each patient quality time with our provider, please arrive at least 15 minutes before your scheduled appointment time.   If you have a lab appointment with the [facility] please come in thru the Main Entrance and check in at the main information desk.  You need to re-schedule your appointment should you arrive 10 or more minutes late.  We strive to give you quality time with our providers, and arriving late affects you and other patients whose appointments are after yours.  Also, if you no show three or more times for appointments you may be dismissed from the clinic at the providers discretion.     Again, thank you for choosing [facility].  Our hope is that these requests will decrease the amount of time that you wait before being seen by our physicians.        _____________________________________________________________  Should you have questions after your visit to [facility], please contact our office at [phone] and follow the prompts.  Our office hours are 8:00 a.m. and 4:30 p.m. Monday - Friday.  Please note that voicemails left after 4:00 p.m. may not be returned until the following business day.  We are closed weekends and major holidays.  You do have access to a nurse 24-7, just call the main number to the clinic [phone] and do not press any options, hold on the line and a nurse will answer the phone.    For prescription refill requests, have your pharmacy contact our office and allow 72 hours.

## 2022-08-20 LAB — COPPER, SERUM: Copper: 106 ug/dL (ref 80–158)

## 2022-09-01 ENCOUNTER — Encounter: Payer: BC Managed Care – PPO | Admitting: Hematology

## 2022-09-04 ENCOUNTER — Encounter: Payer: BC Managed Care – PPO | Admitting: Hematology

## 2022-09-08 IMAGING — DX DG NECK SOFT TISSUE
2 series · 2 of 2 positions shown · non-contrast
Comparison: None.

CLINICAL DATA: 54-year-old female with neck lump

EXAM:
NECK SOFT TISSUES - 1+ VIEW

[neck lat]
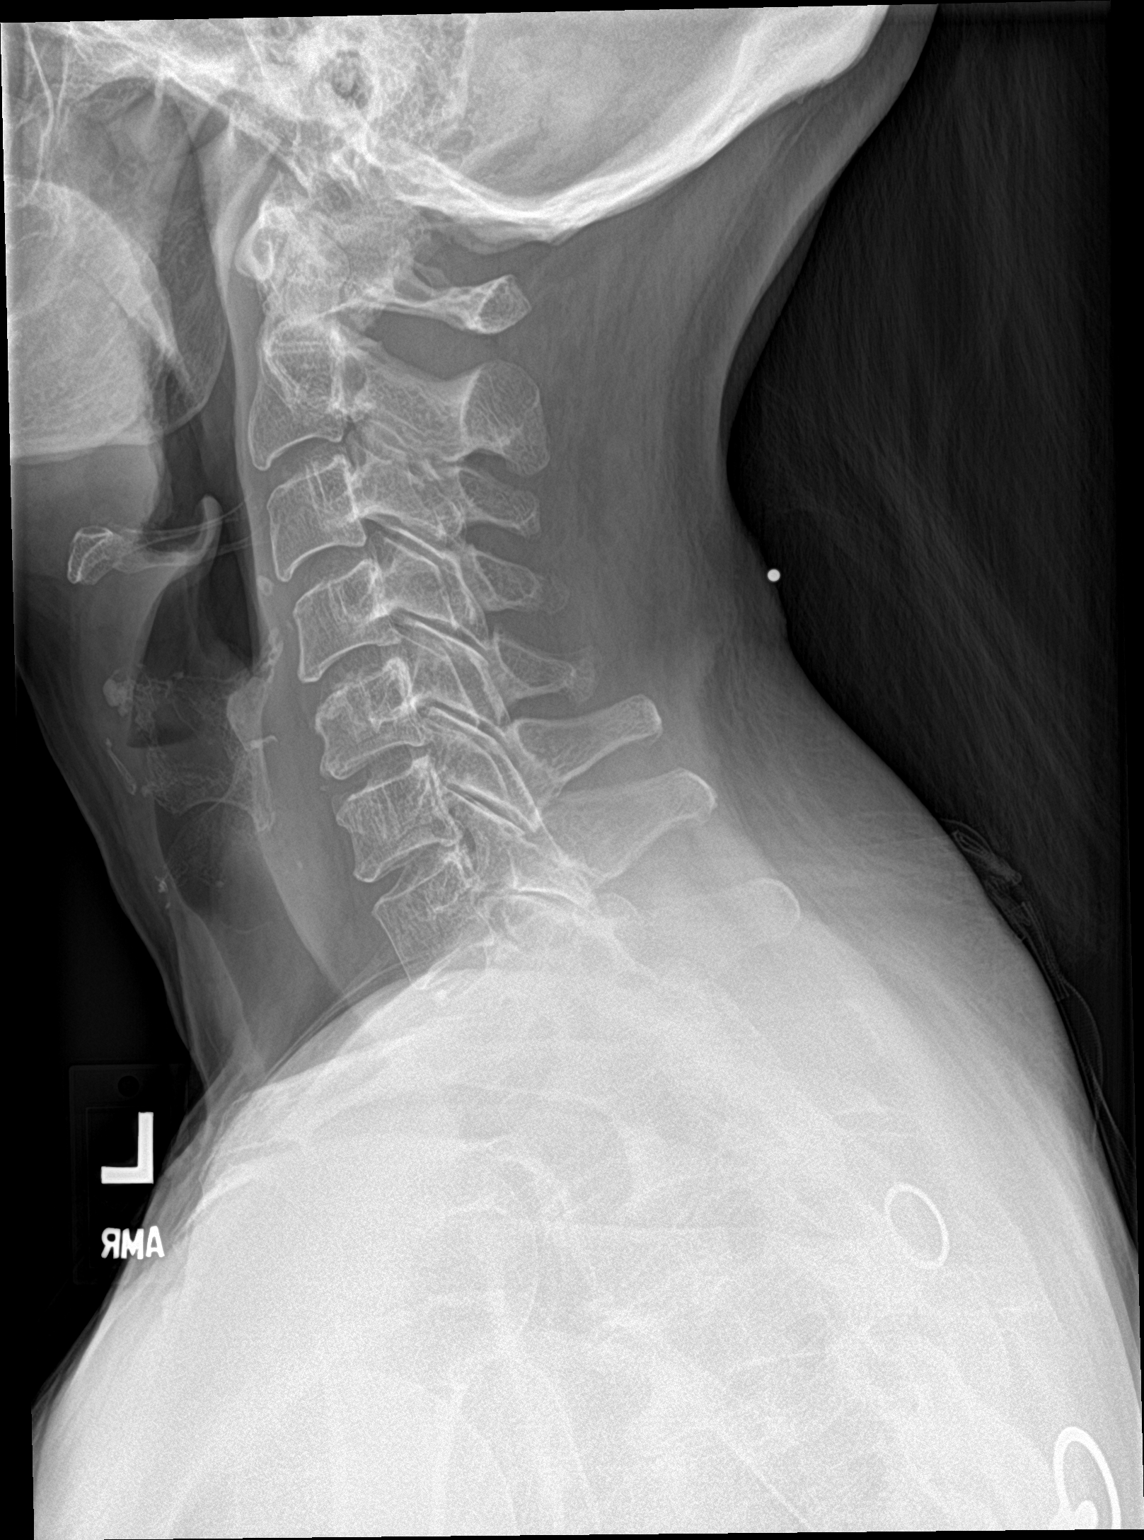

[neck ap]
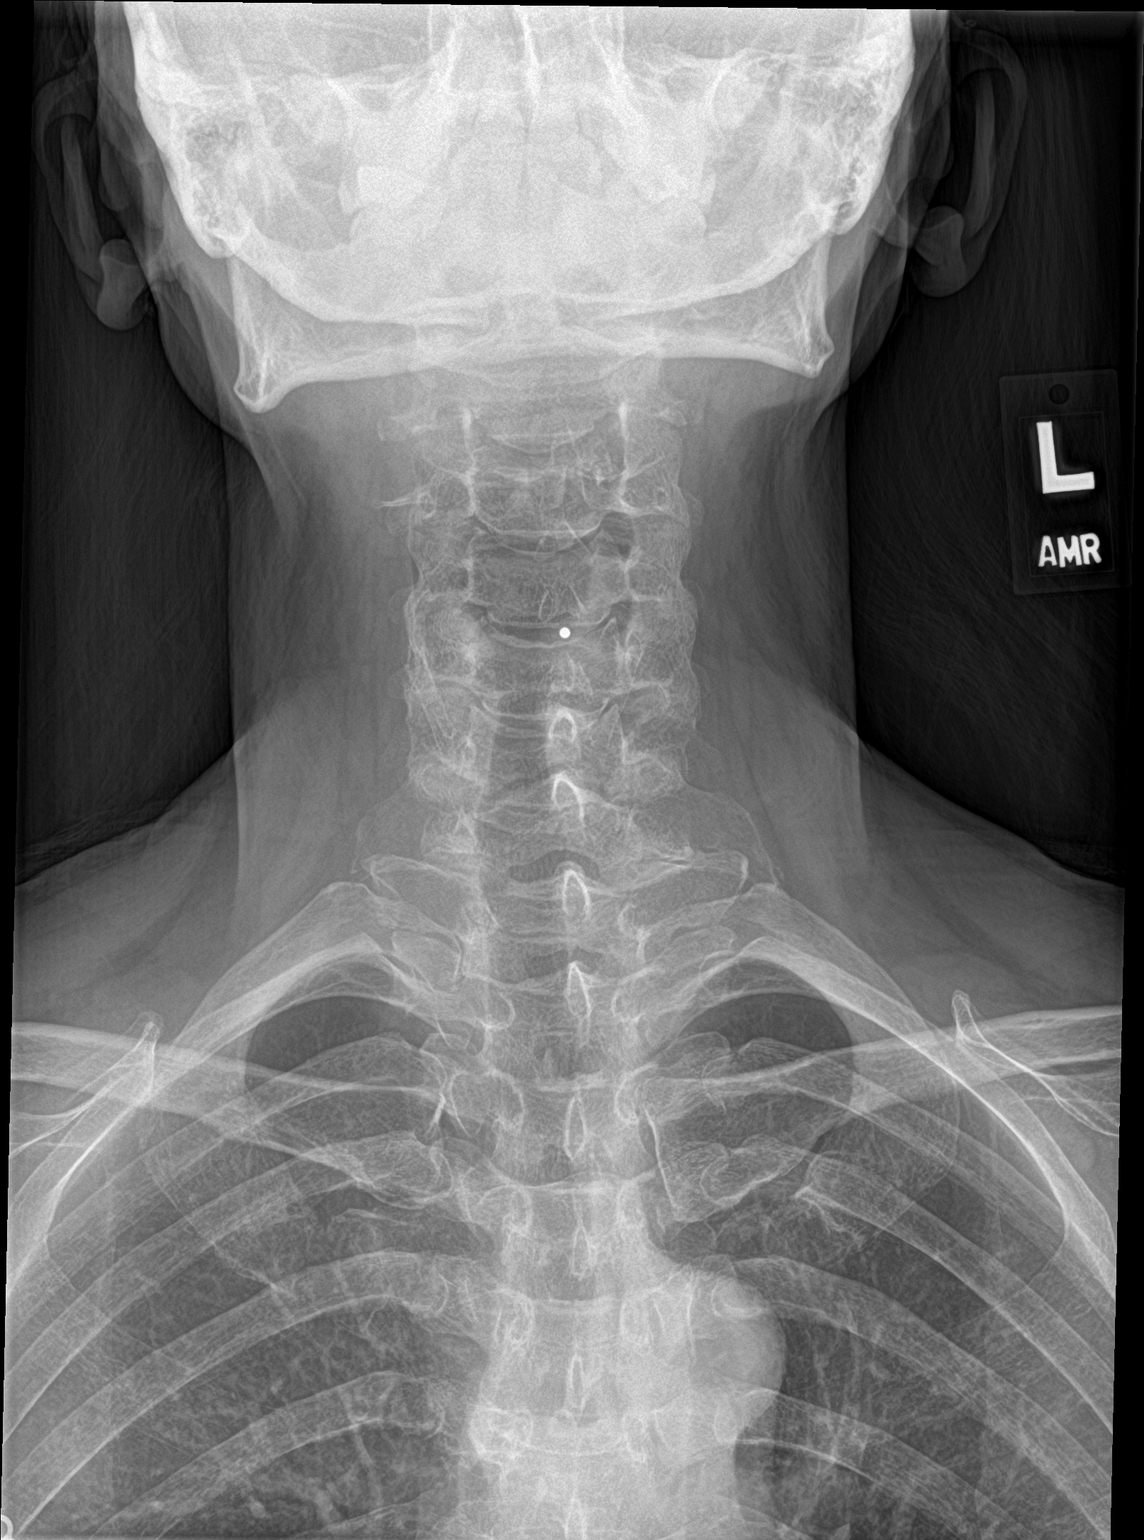

[2 of 2 positions shown; findings below may reference images not displayed]

FINDINGS: There is no evidence of retropharyngeal soft tissue swelling or
epiglottic enlargement. The cervical airway is unremarkable and no
radio-opaque foreign body identified.

Fiducial marker within the midline posterior soft tissues of the
neck without corresponding plain film abnormality.
IMPRESSION: Negative plain film soft tissues of the neck

## 2022-10-12 ENCOUNTER — Other Ambulatory Visit (INDEPENDENT_AMBULATORY_CARE_PROVIDER_SITE_OTHER): Payer: Self-pay | Admitting: Gastroenterology

## 2022-10-30 ENCOUNTER — Encounter (INDEPENDENT_AMBULATORY_CARE_PROVIDER_SITE_OTHER): Payer: Self-pay

## 2022-10-30 ENCOUNTER — Other Ambulatory Visit (INDEPENDENT_AMBULATORY_CARE_PROVIDER_SITE_OTHER): Payer: Self-pay

## 2022-10-30 DIAGNOSIS — E785 Hyperlipidemia, unspecified: Secondary | ICD-10-CM

## 2022-10-30 DIAGNOSIS — E7800 Pure hypercholesterolemia, unspecified: Secondary | ICD-10-CM

## 2022-10-30 DIAGNOSIS — R5383 Other fatigue: Secondary | ICD-10-CM

## 2022-10-30 DIAGNOSIS — D72819 Decreased white blood cell count, unspecified: Secondary | ICD-10-CM

## 2022-10-30 DIAGNOSIS — K51918 Ulcerative colitis, unspecified with other complication: Secondary | ICD-10-CM

## 2022-10-30 DIAGNOSIS — K5180 Other ulcerative colitis without complications: Secondary | ICD-10-CM

## 2022-10-30 DIAGNOSIS — K5190 Ulcerative colitis, unspecified, without complications: Secondary | ICD-10-CM

## 2022-10-30 DIAGNOSIS — R7989 Other specified abnormal findings of blood chemistry: Secondary | ICD-10-CM

## 2022-11-12 LAB — COMPREHENSIVE METABOLIC PANEL
AG Ratio: 1.7 (calc) (ref 1.0–2.5)
ALT: 24 U/L (ref 6–29)
AST: 23 U/L (ref 10–35)
Albumin: 4.1 g/dL (ref 3.6–5.1)
Alkaline phosphatase (APISO): 65 U/L (ref 37–153)
BUN: 11 mg/dL (ref 7–25)
CO2: 30 mmol/L (ref 20–32)
Calcium: 9.2 mg/dL (ref 8.6–10.4)
Chloride: 106 mmol/L (ref 98–110)
Creat: 0.65 mg/dL (ref 0.50–1.03)
Globulin: 2.4 g/dL (calc) (ref 1.9–3.7)
Glucose, Bld: 95 mg/dL (ref 65–99)
Potassium: 3.8 mmol/L (ref 3.5–5.3)
Sodium: 142 mmol/L (ref 135–146)
Total Bilirubin: 0.5 mg/dL (ref 0.2–1.2)
Total Protein: 6.5 g/dL (ref 6.1–8.1)

## 2022-11-12 LAB — CBC WITH DIFFERENTIAL/PLATELET
Absolute Monocytes: 638 cells/uL (ref 200–950)
Basophils Absolute: 29 cells/uL (ref 0–200)
Basophils Relative: 0.7 %
Eosinophils Absolute: 42 cells/uL (ref 15–500)
Eosinophils Relative: 1 %
HCT: 38.4 % (ref 35.0–45.0)
Hemoglobin: 13 g/dL (ref 11.7–15.5)
Lymphs Abs: 1365 cells/uL (ref 850–3900)
MCH: 32.1 pg (ref 27.0–33.0)
MCHC: 33.9 g/dL (ref 32.0–36.0)
MCV: 94.8 fL (ref 80.0–100.0)
MPV: 9.2 fL (ref 7.5–12.5)
Monocytes Relative: 15.2 %
Neutro Abs: 2125 cells/uL (ref 1500–7800)
Neutrophils Relative %: 50.6 %
Platelets: 245 10*3/uL (ref 140–400)
RBC: 4.05 10*6/uL (ref 3.80–5.10)
RDW: 12 % (ref 11.0–15.0)
Total Lymphocyte: 32.5 %
WBC: 4.2 10*3/uL (ref 3.8–10.8)

## 2022-11-25 ENCOUNTER — Ambulatory Visit (INDEPENDENT_AMBULATORY_CARE_PROVIDER_SITE_OTHER): Payer: BC Managed Care – PPO | Admitting: Adult Health

## 2022-11-25 ENCOUNTER — Encounter: Payer: Self-pay | Admitting: Adult Health

## 2022-11-25 VITALS — BP 130/86 | HR 63 | Ht 63.0 in | Wt 145.5 lb

## 2022-11-25 DIAGNOSIS — Z1211 Encounter for screening for malignant neoplasm of colon: Secondary | ICD-10-CM

## 2022-11-25 DIAGNOSIS — Z01419 Encounter for gynecological examination (general) (routine) without abnormal findings: Secondary | ICD-10-CM

## 2022-11-25 LAB — HEMOCCULT GUIAC POC 1CARD (OFFICE): Fecal Occult Blood, POC: NEGATIVE

## 2022-11-25 NOTE — Progress Notes (Signed)
Patient ID: Sandra Crane, female   DOB: 1966-04-21, 57 y.o.   MRN: [id number] History of Present Illness: Sandra Crane is a 57 year old white female, married, PM in for well woman gyn exam. She has had URI, took antibiotics still has cough.  Last pap was negative HPV and NILM 11/21/20  PCP is Dr Gerarda Fraction.  Current Medications, Allergies, Past Medical History, Past Surgical History, Family History and Social History were reviewed in [facility] record.     Review of Systems: Patient denies any headaches, hearing loss, fatigue, blurred vision, shortness of breath, chest pain, abdominal pain, problems with bowel movements, urination, or intercourse. No joint pain or mood swings. Denies any vaginal bleeding   Physical Exam:BP 130/86 (BP Location: Right Arm, Patient Position: Sitting, Cuff Size: Normal)   Pulse 63   Ht '5\' 3"'$  (1.6 m)   Wt 145 lb 8 oz (66 kg)   BMI 25.77 kg/m   General:  Well developed, well nourished, no acute distress Skin:  Warm and dry Neck:  Midline trachea, normal thyroid, good ROM, no lymphadenopathy Lungs; Clear to auscultation bilaterally Breast:  No dominant palpable mass, retraction, or nipple discharge Cardiovascular: Regular rate and rhythm Abdomen:  Soft, non tender, no hepatosplenomegaly Pelvic:  External genitalia is normal in appearance, no lesions.  The vagina is normal in appearance. Urethra has no lesions or masses. The cervix is bulbous is smooth.  Uterus is felt to be normal size, shape, and contour.  No adnexal masses or tenderness noted.Bladder is non tender, no masses felt. Rectal: Good sphincter tone, no polyps, or hemorrhoids felt.  Hemoccult negative. Extremities/musculoskeletal:  No swelling or varicosities noted, no clubbing or cyanosis Psych:  No mood changes, alert and cooperative,seems happy AA is 1 Fall risk is low    11/25/2022    9:48 AM 11/24/2021   10:31 AM 11/21/2020    2:43 PM  Depression screen PHQ 2/9   Decreased Interest 0 0 0  Down, Depressed, Hopeless 0 0 0  PHQ - 2 Score 0 0 0  Altered sleeping 0 0 0  Tired, decreased energy 0 0 0  Change in appetite 0 0 0  Feeling bad or failure about yourself  0 0 0  Trouble concentrating 0 0 0  Moving slowly or fidgety/restless 0 0 0  Suicidal thoughts 0 0 0  PHQ-9 Score 0 0 0       11/25/2022    9:48 AM 11/24/2021   10:32 AM 11/21/2020    2:43 PM  GAD 7 : Generalized Anxiety Score  Nervous, Anxious, on Edge 0 0 0  Control/stop worrying 0 0 0  Worry too much - different things 0 0 0  Trouble relaxing 0 0 0  Restless 0 0 0  Easily annoyed or irritable 0 0 0  Afraid - awful might happen 0 0 0  Total GAD 7 Score 0 0 0      Upstream - 11/25/22 0957       Pregnancy Intention Screening   Does the patient want to become pregnant in the next year? N/A    Does the patient's partner want to become pregnant in the next year? N/A    Would the patient like to discuss contraceptive options today? N/A      Contraception Wrap Up   Current Method No Method - Other Reason   postmenopausal   End Method No Method - Other Reason   postmenopausal   Contraception Counseling Provided No  Examination chaperoned by Levy Pupa LPN   Impression and Plan: 1. Encounter for well woman exam with routine gynecological exam Pap and physical in 1 year Labs with PCP Mammogram was negative 06/22/22 Colonoscopy per GI  Stay active    2. Encounter for screening fecal occult blood testing Hemoccult was negative

## 2023-01-08 ENCOUNTER — Encounter (INDEPENDENT_AMBULATORY_CARE_PROVIDER_SITE_OTHER): Payer: Self-pay

## 2023-06-07 ENCOUNTER — Other Ambulatory Visit: Payer: Self-pay | Admitting: Adult Health

## 2023-06-07 DIAGNOSIS — Z1231 Encounter for screening mammogram for malignant neoplasm of breast: Secondary | ICD-10-CM

## 2023-06-28 ENCOUNTER — Ambulatory Visit: Payer: BC Managed Care – PPO | Admitting: Adult Health

## 2023-06-30 ENCOUNTER — Ambulatory Visit
Admission: RE | Admit: 2023-06-30 | Discharge: 2023-06-30 | Disposition: A | Discharge status: Home / Self Care | Payer: BC Managed Care – PPO | Source: Ambulatory Visit | Attending: Adult Health | Admitting: Adult Health

## 2023-06-30 DIAGNOSIS — Z1231 Encounter for screening mammogram for malignant neoplasm of breast: Secondary | ICD-10-CM

## 2023-08-02 ENCOUNTER — Encounter: Payer: Self-pay | Admitting: Oncology

## 2023-08-02 ENCOUNTER — Ambulatory Visit (INDEPENDENT_AMBULATORY_CARE_PROVIDER_SITE_OTHER): Payer: BC Managed Care – PPO | Admitting: Gastroenterology

## 2023-08-02 ENCOUNTER — Encounter (INDEPENDENT_AMBULATORY_CARE_PROVIDER_SITE_OTHER): Payer: Self-pay | Admitting: Gastroenterology

## 2023-08-02 VITALS — BP 119/69 | HR 59 | Temp 98.5°F | Ht 63.0 in | Wt 134.2 lb

## 2023-08-02 DIAGNOSIS — K5180 Other ulcerative colitis without complications: Secondary | ICD-10-CM | POA: Diagnosis not present

## 2023-08-02 NOTE — Progress Notes (Unsigned)
Sandra Crane, M.D. Gastroenterology & Hepatology [facility] [address] [place], [place] [address]  Primary Care Physician: Sandra Nevins, MD [address] [place] [address]  I will communicate my assessment and recommendations to the referring MD via EMR.  Problems: Ulcerative colitis  History of Present Illness: Sandra Crane is a 57 y.o. female with past medical history of ulcerative colitis, arthritis and hyperlipidemia, who presents for follow up of UC.  The patient was last seen on 07/30/22. At that time, the patient was continued on Lialda 2.4 g BID.  Patient reached [place] as she was having a dry mouth, asked to go down in her Lialda dose. Since March has been on Lialda 1.2 g BID. She noticed her dry mouth got better, but she is still presenting some dry mouth occasionally for which she has to take mint.  States feeling well. Has 1-2 Bms per day. Stools are sometimes soft, but usually is formed. The patient denies having any nausea, vomiting, fever, chills, hematochezia, melena, hematemesis, abdominal distention, abdominal pain, diarrhea, jaundice, pruritus or weight loss.  Most recent labs from 11/12/2022 showed a normal CMP and CBC. She will have repeat blood testing in early October.  Last flu shot:2023 Last pneumonia shot:never Last Pap smear: due for repeat in 11/2023 Last zoster vaccine: received in the past , does not remember when COVID-19 shot: possibly 3 doses  Last Colonoscopy:(08/08/20)- The examined portion of the ileum was normal.- The entire examined colon is normal.- External hemorrhoids. - No specimens collected. Last Endoscopy:n/a   Recommend repeat colonoscopy in 5 years  Past Medical History: Past Medical History:  Diagnosis Date   Arthritis    Contraceptive management 09/12/2013   Elevated cholesterol    Ulcerative colitis     Past Surgical History: Past Surgical History:   Procedure Laterality Date   CESAREAN SECTION  [phone]   COLONOSCOPY     COLONOSCOPY N/A 10/24/2015   Procedure: COLONOSCOPY;  Surgeon: Sandra Hippo, MD;  [facility];  Service: Endoscopy;  Laterality: N/A;  12:00   COLONOSCOPY N/A 08/08/2020   Sandra Crane: The examined portion of the ileum was normal.- The entire examined colon is normal.- External hemorrhoids. no specimens collected   FOOT SURGERY Right 02/21/2020   METATARSAL OSTEOTOMY Right 02/21/2020   Procedure: DISTAL METATARSAL OSTEOTOMY;  Surgeon: Sandra Crane, DPM;  Location[facility];  Service: Podiatry;  Laterality: Right;   REPAIR EXTENSOR TENDON Right 02/21/2020   Procedure: LENGTHENING OF EXTENSOR TENDON;  Surgeon: Sandra Crane, DPM;  Location[facility];  Service: Podiatry;  Laterality: Right;   root canal with crown     SIGMOIDOSCOPY  07/19/2009   TONSILLECTOMY AND ADENOIDECTOMY N/A 11/10/2016   Procedure: TONSILLECTOMY AND ADENOIDECTOMY;  Surgeon: Sandra Pies, MD;  Location: [facility];  Service: ENT;  Laterality: N/A;   WISDOM TOOTH EXTRACTION      Family History: Family History  Problem Relation Age of Onset   Thyroid cancer Mother    Diabetes Mother    Cancer Mother    Diabetes Father    Hypertension Sister    Hyperlipidemia Sister    Diabetes Sister    Healthy Brother    Healthy Sister    Healthy Brother    Glaucoma Brother    Healthy Daughter    Healthy Daughter    Healthy Son     Social History: Social History   Tobacco Use  Smoking Status Never  Smokeless Tobacco Never  Social History   Substance and Sexual Activity  Alcohol Use Yes   Comment: occ   Social History   Substance and Sexual Activity  Drug Use No    Allergies: Allergies  Allergen Reactions   Tramadol Other (See Comments)    Insomnia    Medications: Current Outpatient Medications  Medication Sig Dispense Refill   Boswellia-Glucosamine-Vit D (OSTEO BI-FLEX ONE PER DAY PO) Take  1 tablet by mouth daily.      mesalamine (LIALDA) 1.2 g EC tablet TAKE 2 TABLETS(2.4 GRAMS) BY MOUTH TWICE DAILY 120 tablet 5   Multiple Vitamin (MULTIVITAMIN WITH MINERALS) TABS tablet Take 1 tablet by mouth daily. With Iron     simvastatin (ZOCOR) 80 MG tablet Take 80 mg by mouth daily.     No current facility-administered medications for this visit.    Review of Systems: GENERAL: negative for malaise, night sweats HEENT: No changes in hearing or vision, no nose bleeds or other nasal problems. NECK: Negative for lumps, goiter, pain and significant neck swelling RESPIRATORY: Negative for cough, wheezing CARDIOVASCULAR: Negative for chest pain, leg swelling, palpitations, orthopnea GI: SEE HPI MUSCULOSKELETAL: Negative for joint pain or swelling, back pain, and muscle pain. SKIN: Negative for lesions, rash PSYCH: Negative for sleep disturbance, mood disorder and recent psychosocial stressors. HEMATOLOGY Negative for prolonged bleeding, bruising easily, and swollen nodes. ENDOCRINE: Negative for cold or heat intolerance, polyuria, polydipsia and goiter. NEURO: negative for tremor, gait imbalance, syncope and seizures. The remainder of the review of systems is noncontributory.   Physical Exam: BP 119/69   Pulse (!) 59   Temp 98.5 F (36.9 C)   Ht 5\' 3"  (1.6 m)   Wt 134 lb 3.2 oz (60.9 kg)   BMI 23.77 kg/m  GENERAL: The patient is AO x3, in no acute distress. HEENT: Head is normocephalic and atraumatic. EOMI are intact. Mouth is well hydrated and without lesions. NECK: Supple. No masses LUNGS: Clear to auscultation. No presence of rhonchi/wheezing/rales. Adequate chest expansion HEART: RRR, normal s1 and s2. ABDOMEN: Soft, nontender, no guarding, no peritoneal signs, and nondistended. BS +. No masses. EXTREMITIES: Without any cyanosis, clubbing, rash, lesions or edema. NEUROLOGIC: AOx3, no focal motor deficit. SKIN: no jaundice, no rashes  Imaging/Labs: as above  I  personally reviewed and interpreted the available labs, imaging and endoscopic files.  Impression and Plan: Sandra Crane is a 57 y.o. female with past medical history of ulcerative colitis, arthritis and hyperlipidemia, who presents for follow up of UC. Patient has had clinical remission with the use of Lialda, without worsening symptoms at lower dose. She should continue this for now. We will check a repeat BMP .  Finally, she is due for flu and pneumonia vaccination, should ask PCP for these vaccines.  -Continue Lialda 1.2 g twice a day -Ask PCP about flu and influenza shots -Check BMP  All questions were answered.      Sandra Blazing, MD Gastroenterology and Hepatology [facility]

## 2023-08-02 NOTE — Patient Instructions (Signed)
Continue Lialda 1.2 g twice a day Ask PCP about flu and pneumonia shots Perform blood workup

## 2023-08-11 ENCOUNTER — Other Ambulatory Visit (HOSPITAL_COMMUNITY)
Admission: RE | Admit: 2023-08-11 | Discharge: 2023-08-11 | Disposition: A | Discharge status: Home / Self Care | Payer: BC Managed Care – PPO | Source: Ambulatory Visit | Attending: Physician Assistant | Admitting: Physician Assistant

## 2023-08-11 ENCOUNTER — Other Ambulatory Visit (INDEPENDENT_AMBULATORY_CARE_PROVIDER_SITE_OTHER): Payer: Self-pay | Admitting: Gastroenterology

## 2023-08-11 ENCOUNTER — Other Ambulatory Visit: Payer: Self-pay

## 2023-08-11 ENCOUNTER — Inpatient Hospital Stay: Payer: BC Managed Care – PPO | Attending: Hematology

## 2023-08-11 ENCOUNTER — Other Ambulatory Visit (HOSPITAL_COMMUNITY)
Admission: RE | Admit: 2023-08-11 | Discharge: 2023-08-11 | Disposition: A | Discharge status: Home / Self Care | Payer: BC Managed Care – PPO | Source: Ambulatory Visit | Attending: Gastroenterology | Admitting: Gastroenterology

## 2023-08-11 DIAGNOSIS — D72819 Decreased white blood cell count, unspecified: Secondary | ICD-10-CM

## 2023-08-11 DIAGNOSIS — D709 Neutropenia, unspecified: Secondary | ICD-10-CM | POA: Diagnosis present

## 2023-08-11 DIAGNOSIS — Z79899 Other long term (current) drug therapy: Secondary | ICD-10-CM | POA: Insufficient documentation

## 2023-08-11 DIAGNOSIS — Z808 Family history of malignant neoplasm of other organs or systems: Secondary | ICD-10-CM | POA: Diagnosis not present

## 2023-08-11 DIAGNOSIS — Z809 Family history of malignant neoplasm, unspecified: Secondary | ICD-10-CM | POA: Diagnosis not present

## 2023-08-11 LAB — CBC WITH DIFFERENTIAL/PLATELET
Abs Immature Granulocytes: 0.01 10*3/uL (ref 0.00–0.07)
Basophils Absolute: 0 10*3/uL (ref 0.0–0.1)
Basophils Relative: 1 %
Eosinophils Absolute: 0 10*3/uL (ref 0.0–0.5)
Eosinophils Relative: 1 %
HCT: 41.3 % (ref 36.0–46.0)
Hemoglobin: 13.5 g/dL (ref 12.0–15.0)
Immature Granulocytes: 0 %
Lymphocytes Relative: 27 %
Lymphs Abs: 0.9 10*3/uL (ref 0.7–4.0)
MCH: 32 pg (ref 26.0–34.0)
MCHC: 32.7 g/dL (ref 30.0–36.0)
MCV: 97.9 fL (ref 80.0–100.0)
Monocytes Absolute: 0.5 10*3/uL (ref 0.1–1.0)
Monocytes Relative: 16 %
Neutro Abs: 1.9 10*3/uL (ref 1.7–7.7)
Neutrophils Relative %: 55 %
Platelets: 216 10*3/uL (ref 150–400)
RBC: 4.22 MIL/uL (ref 3.87–5.11)
RDW: 13 % (ref 11.5–15.5)
WBC: 3.4 10*3/uL — ABNORMAL LOW (ref 4.0–10.5)
nRBC: 0 % (ref 0.0–0.2)

## 2023-08-11 LAB — BASIC METABOLIC PANEL
Anion gap: 9 (ref 5–15)
BUN: 13 mg/dL (ref 6–20)
CO2: 27 mmol/L (ref 22–32)
Calcium: 9 mg/dL (ref 8.9–10.3)
Chloride: 103 mmol/L (ref 98–111)
Creatinine, Ser: 0.61 mg/dL (ref 0.44–1.00)
GFR, Estimated: >60 mL/min (ref >60)
Glucose, Bld: 94 mg/dL (ref 70–99)
Potassium: 3.2 mmol/L — ABNORMAL LOW (ref 3.5–5.1)
Sodium: 139 mmol/L (ref 135–145)

## 2023-08-11 LAB — LACTATE DEHYDROGENASE: LDH: 137 U/L (ref 98–192)

## 2023-08-11 MED ORDER — POTASSIUM CHLORIDE CRYS ER 20 MEQ PO TBCR: 20.0000 meq | EXTENDED_RELEASE_TABLET | Freq: Every day | ORAL | 0 refills | Status: DC

## 2023-08-12 ENCOUNTER — Other Ambulatory Visit (INDEPENDENT_AMBULATORY_CARE_PROVIDER_SITE_OTHER): Payer: Self-pay | Admitting: Gastroenterology

## 2023-08-12 NOTE — Telephone Encounter (Signed)
Patient will follow up with pcp for further fills.

## 2023-08-20 ENCOUNTER — Inpatient Hospital Stay: Payer: BC Managed Care – PPO

## 2023-08-20 ENCOUNTER — Inpatient Hospital Stay: Payer: BC Managed Care – PPO | Admitting: Oncology

## 2023-08-20 VITALS — BP 110/79 | HR 68 | Temp 98.2°F | Resp 16 | Wt 130.5 lb

## 2023-08-20 DIAGNOSIS — D72819 Decreased white blood cell count, unspecified: Secondary | ICD-10-CM | POA: Diagnosis not present

## 2023-08-20 DIAGNOSIS — D709 Neutropenia, unspecified: Secondary | ICD-10-CM | POA: Diagnosis not present

## 2023-08-20 NOTE — Progress Notes (Signed)
[facility] [address], [place] [address]   CLINIC:  Medical Oncology/Hematology  PCP:  Elfredia Nevins, MD [address] [place] [address] [phone]   REASON FOR VISIT:  Follow-up for leukopenia  PRIOR THERAPY: None  CURRENT THERAPY: Under work-up  INTERVAL HISTORY:  Ms. Sandra Crane 57 y.o. female returns for routine follow-up of her intermittent leukopenia.  She was seen in clinic on 08/19/2022 by Rojelio Brenner, PA.  In the interim, she denies any hospitalizations, surgeries or changes in baseline health.  Appetite and energy levels are 100%.  Denies any pain.  She was evaluated by GYN on 11/25/2022 for annual checkup.  Everything appeared to be WNL.  Mammogram negative from 06/30/23 was negative.  She denies any recurrent infections.  Reports she has been having some postnasal drainage and mucus production that occasionally will make it hard for her to talk, swallow and breathe.  She is currently not taking anything OTC for this.  Denies any ulcerative colitis flares.  REVIEW OF SYSTEMS:  Review of Systems  HENT:          Postnasal drainage and sputum production.      PAST MEDICAL/SURGICAL HISTORY:  Past Medical History:  Diagnosis Date   Arthritis    Contraceptive management 09/12/2013   Elevated cholesterol    Ulcerative colitis    Past Surgical History:  Procedure Laterality Date   CESAREAN SECTION  [id number]   COLONOSCOPY     COLONOSCOPY N/A 10/24/2015   Procedure: COLONOSCOPY;  Surgeon: Malissa Hippo, MD;  [facility];  Service: Endoscopy;  Laterality: N/A;  12:00   COLONOSCOPY N/A 08/08/2020   REhman: The examined portion of the ileum was normal.- The entire examined colon is normal.- External hemorrhoids. no specimens collected   FOOT SURGERY Right 02/21/2020   METATARSAL OSTEOTOMY Right 02/21/2020   Procedure: DISTAL METATARSAL OSTEOTOMY;  Surgeon: Erskine Emery, DPM;  Location: [facility];   Service: Podiatry;  Laterality: Right;   REPAIR EXTENSOR TENDON Right 02/21/2020   Procedure: LENGTHENING OF EXTENSOR TENDON;  Surgeon: Erskine Emery, DPM;  Location: [facility];  Service: Podiatry;  Laterality: Right;   root canal with crown     SIGMOIDOSCOPY  07/19/2009   TONSILLECTOMY AND ADENOIDECTOMY N/A 11/10/2016   Procedure: TONSILLECTOMY AND ADENOIDECTOMY;  Surgeon: Newman Pies, MD;  Location: [facility];  Service: ENT;  Laterality: N/A;   WISDOM TOOTH EXTRACTION       SOCIAL HISTORY:  Social History   Socioeconomic History   Marital status: Married    Spouse name: Not on file   Number of children: 3   Years of education: Not on file   Highest education level: Not on file  Occupational History   Not on file  Tobacco Use   Smoking status: Never   Smokeless tobacco: Never  Vaping Use   Vaping status: Never Used  Substance and Sexual Activity   Alcohol use: Yes    Comment: occ   Drug use: No   Sexual activity: Yes    Birth control/protection: Post-menopausal  Other Topics Concern   Not on file  Social History Narrative   Not on file   Social Determinants of Health   Financial Resource Strain: Low Risk  (11/25/2022)   Overall Financial Resource Strain (CARDIA)    Difficulty of Paying Living Expenses: Not hard at all  Food Insecurity: No Food Insecurity (11/25/2022)   Hunger Vital Sign    Worried About Running Out  of Food in the Last Year: Never true    Ran Out of Food in the Last Year: Never true  Transportation Needs: No Transportation Needs (11/25/2022)   PRAPARE - Administrator, Civil Service (Medical): No    Lack of Transportation (Non-Medical): No  Physical Activity: Insufficiently Active (11/25/2022)   Exercise Vital Sign    Days of Exercise per Week: 3 days    Minutes of Exercise per Session: 40 min  Stress: No Stress Concern Present (11/25/2022)   [employer] of Occupational Health - Occupational Stress Questionnaire     Feeling of Stress : Not at all  Social Connections: Moderately Isolated (11/25/2022)   Social Connection and Isolation Panel [NHANES]    Frequency of Communication with Friends and Family: More than three times a week    Frequency of Social Gatherings with Friends and Family: Once a week    Attends Religious Services: Never    Database administrator or Organizations: No    Attends Banker Meetings: Never    Marital Status: Married  Catering manager Violence: Not At Risk (11/25/2022)   Humiliation, Afraid, Rape, and Kick questionnaire    Fear of Current or Ex-Partner: No    Emotionally Abused: No    Physically Abused: No    Sexually Abused: No    FAMILY HISTORY:  Family History  Problem Relation Age of Onset   Thyroid cancer Mother    Diabetes Mother    Cancer Mother    Diabetes Father    Hypertension Sister    Hyperlipidemia Sister    Diabetes Sister    Healthy Brother    Healthy Sister    Healthy Brother    Glaucoma Brother    Healthy Daughter    Healthy Daughter    Healthy Son     CURRENT MEDICATIONS:  Outpatient Encounter Medications as of 08/20/2023  Medication Sig   Boswellia-Glucosamine-Vit D (OSTEO BI-FLEX ONE PER DAY PO) Take 1 tablet by mouth daily.    mesalamine (LIALDA) 1.2 g EC tablet TAKE 2 TABLETS(2.4 GRAMS) BY MOUTH TWICE DAILY   Multiple Vitamin (MULTIVITAMIN WITH MINERALS) TABS tablet Take 1 tablet by mouth daily. With Iron   potassium chloride SA (KLOR-CON M) 20 MEQ tablet Take 1 tablet (20 mEq total) by mouth daily.   simvastatin (ZOCOR) 80 MG tablet Take 80 mg by mouth daily.   No facility-administered encounter medications on file as of 08/20/2023.    ALLERGIES:  Allergies  Allergen Reactions   Tramadol Other (See Comments)    Insomnia     PHYSICAL EXAM:  ECOG PERFORMANCE STATUS: 0 - Asymptomatic  There were no vitals filed for this visit. There were no vitals filed for this visit. Physical Exam Constitutional:       Appearance: Normal appearance.  HENT:     Mouth/Throat:     Pharynx: Postnasal drip present.  Cardiovascular:     Rate and Rhythm: Normal rate and regular rhythm.  Pulmonary:     Effort: Pulmonary effort is normal.     Breath sounds: Normal breath sounds.  Abdominal:     General: Bowel sounds are normal.     Palpations: Abdomen is soft.  Musculoskeletal:        General: No swelling. Normal range of motion.  Neurological:     Mental Status: She is alert and oriented to person, place, and time. Mental status is at baseline.      LABORATORY DATA:  I have reviewed  the labs as listed.  CBC    Component Value Date/Time   WBC 3.4 (L) 08/11/2023 0810   RBC 4.22 08/11/2023 0810   HGB 13.5 08/11/2023 0810   HCT 41.3 08/11/2023 0810   PLT 216 08/11/2023 0810   MCV 97.9 08/11/2023 0810   MCH 32.0 08/11/2023 0810   MCHC 32.7 08/11/2023 0810   RDW 13.0 08/11/2023 0810   LYMPHSABS 0.9 08/11/2023 0810   MONOABS 0.5 08/11/2023 0810   EOSABS 0.0 08/11/2023 0810   BASOSABS 0.0 08/11/2023 0810      Latest Ref Rng & Units 08/11/2023    8:15 AM 11/12/2022    8:27 AM 02/12/2022    8:19 AM  CMP  Glucose 70 - 99 mg/dL 94  95    BUN 6 - 20 mg/dL 13  11    Creatinine 1.61 - 1.00 mg/dL 0.96  0.45    Sodium 409 - 145 mmol/L 139  142    Potassium 3.5 - 5.1 mmol/L 3.2  3.8    Chloride 98 - 111 mmol/L 103  106    CO2 22 - 32 mmol/L 27  30    Calcium 8.9 - 10.3 mg/dL 9.0  9.2    Total Protein 6.1 - 8.1 g/dL  6.5  6.8   Total Bilirubin 0.2 - 1.2 mg/dL  0.5  0.6   AST 10 - 35 U/L  23  16   ALT 6 - 29 U/L  24  9     DIAGNOSTIC IMAGING:  I have independently reviewed the relevant imaging and discussed with the patient.  ASSESSMENT & PLAN: 1.  Chronic intermittent leukopenia with mild neutropenia - Patient seen at the request of Dr. Sherwood Gambler for leukopenia. - CBC (08/08/2022): WBC 2.8, differential 54% neutrophils, 27% lymphocytes, 16% monocytes, 2% eosinophils.  ANC-1.5, ALC-0.8.  Hb and PLT  normal. - B12 763, folic acid normal. - No B symptoms.  No recurrent infections.  No prior history of transfusion. - Review of labs show patient has intermittent leukopenia for the last 5 to 10 years. - She has a history of ulcerative colitis for more than 10 years and is currently on mesalamine since approximately 2012.  Previously used drugs include Lialda, Remicade and 6-MP. - Hematology work-up (08/12/2022) was essentially normal.  Negative hepatitis B/hepatitis C.  Negative RF/ANA.  Normal copper and MMA.  SPEP negative.  Normal LDH and reticulocytes.  2.  Social/family history: - She lives at home with her husband.  She is a retired Geologist, engineering.  Non-smoker. - No family history of leukemia.  Mother had thyroid cancer.  Paternal uncle died of questionable head and neck cancer.  Plan: 1. Chronic leukopenia -Most recent CBC (08/11/2023): WBC at 3.4 with normal differential -Lab work has been fairly stable over the past several years. -Leukopenia likely reactive in the setting of chronic inflammation due to mesalamine and UC.  -She is followed closely by GI who has been checking her blood counts every 3 to 6 months and adjusting medication as needed. -RTC in 1 year with labs a few days before. -Would consider additional testing such as flow cytometry and/or bone marrow biopsy if significant change from baseline.  2.  Postnasal drainage: -Discussed trying Flonase 2 sprays each nostril once or twice a day and possibly Mucinex.  Symptoms likely secondary to allergic rhinitis.  PLAN SUMMARY: >> RTC in 1 year for f/u with labs a few days before.     All questions were answered. The patient  knows to call the clinic with any problems, questions or concerns.  Medical decision making: Low  Time spent on visit: I spent 20 minutes counseling the patient face to face. The total time spent in the appointment was 22 minutes and more than 50% was on counseling.   Mauro Kaufmann, NP   08/19/2022 8:36 AM

## 2023-09-17 ENCOUNTER — Other Ambulatory Visit (INDEPENDENT_AMBULATORY_CARE_PROVIDER_SITE_OTHER): Payer: Self-pay | Admitting: Gastroenterology

## 2023-09-20 NOTE — Telephone Encounter (Signed)
As advised in the past, patient needs to follow-up with PCP regarding further refills for potassium

## 2023-09-20 NOTE — Telephone Encounter (Signed)
Needs to be done by PCP. Please reach out to Dr. Sherwood Gambler. Thanks

## 2023-11-04 ENCOUNTER — Other Ambulatory Visit (INDEPENDENT_AMBULATORY_CARE_PROVIDER_SITE_OTHER): Payer: Self-pay | Admitting: Gastroenterology

## 2023-12-16 ENCOUNTER — Encounter: Payer: Self-pay | Admitting: Adult Health

## 2023-12-16 ENCOUNTER — Ambulatory Visit: Payer: 59 | Admitting: Adult Health

## 2023-12-16 ENCOUNTER — Other Ambulatory Visit (HOSPITAL_COMMUNITY)
Admission: RE | Admit: 2023-12-16 | Discharge: 2023-12-16 | Disposition: A | Discharge status: Home / Self Care | Payer: 59 | Source: Ambulatory Visit | Attending: Adult Health | Admitting: Adult Health

## 2023-12-16 VITALS — BP 110/70 | HR 64 | Ht 63.0 in | Wt 131.0 lb

## 2023-12-16 DIAGNOSIS — Z1211 Encounter for screening for malignant neoplasm of colon: Secondary | ICD-10-CM

## 2023-12-16 DIAGNOSIS — Z01419 Encounter for gynecological examination (general) (routine) without abnormal findings: Secondary | ICD-10-CM | POA: Diagnosis present

## 2023-12-16 DIAGNOSIS — Z1331 Encounter for screening for depression: Secondary | ICD-10-CM

## 2023-12-16 LAB — HEMOCCULT GUIAC POC 1CARD (OFFICE): Fecal Occult Blood, POC: NEGATIVE

## 2023-12-16 NOTE — Progress Notes (Signed)
 Patient ID: Sandra Crane, female   DOB: [birth year], 58 y.o.   MRN: [id number] History of Present Illness: Sandra Crane is a 58 year old white female, married, PM in for a well woman gyn exam and pap.  PCP is Dr Bertell   Current Medications, Allergies, Past Medical History, Past Surgical History, Family History and Social History were reviewed in [employer] electronic medical record.     Review of Systems: Patient denies any headaches, hearing loss, fatigue, blurred vision, shortness of breath, chest pain, abdominal pain, problems with bowel movements, urination, or intercourse. No joint pain or mood swings.  Denies any vaginal bleeding   Physical Exam:BP 110/70 (BP Location: Left Arm, Patient Position: Sitting, Cuff Size: Normal)   Pulse 64   Ht 5' 3 (1.6 m)   Wt 131 lb (59.4 kg)   BMI 23.21 kg/m   General:  Well developed, well nourished, no acute distress Skin:  Warm and dry Neck:  Midline trachea, normal thyroid, good ROM, no lymphadenopathy Lungs; Clear to auscultation bilaterally Breast:  No dominant palpable mass, retraction, or nipple discharge Cardiovascular: Regular rate and rhythm Abdomen:  Soft, non tender, no hepatosplenomegaly Pelvic:  External genitalia is normal in appearance, no lesions.  The vagina is pale. Urethra has no lesions or masses. The cervix is smooth and stenotic at os, pap with HR HPV genotyping performed.  Uterus is felt to be normal size, shape, and contour.  No adnexal masses or tenderness noted.Bladder is non tender, no masses felt. Rectal: Good sphincter tone, no polyps, or hemorrhoids felt.  Hemoccult negative. Extremities/musculoskeletal:  No swelling or varicosities noted, no clubbing or cyanosis Psych:  No mood changes, alert and cooperative,seems happy AA is 2 Fall risk is low    12/16/2023    8:35 AM 11/25/2022    9:48 AM 11/24/2021   10:31 AM  Depression screen PHQ 2/9  Decreased Interest 0 0 0  Down, Depressed, Hopeless 0 0 0   PHQ - 2 Score 0 0 0  Altered sleeping 0 0 0  Tired, decreased energy 0 0 0  Change in appetite 0 0 0  Feeling bad or failure about yourself  0 0 0  Trouble concentrating 0 0 0  Moving slowly or fidgety/restless 0 0 0  Suicidal thoughts 0 0 0  PHQ-9 Score 0 0 0       12/16/2023    8:35 AM 11/25/2022    9:48 AM 11/24/2021   10:32 AM 11/21/2020    2:43 PM  GAD 7 : Generalized Anxiety Score  Nervous, Anxious, on Edge 0 0 0 0  Control/stop worrying 0 0 0 0  Worry too much - different things 0 0 0 0  Trouble relaxing 0 0 0 0  Restless 0 0 0 0  Easily annoyed or irritable 0 0 0 0  Afraid - awful might happen 0 0 0 0  Total GAD 7 Score 0 0 0 0    Upstream - 12/16/23 0841       Pregnancy Intention Screening   Does the patient want to become pregnant in the next year? N/A    Does the patient's partner want to become pregnant in the next year? N/A    Would the patient like to discuss contraceptive options today? N/A      Contraception Wrap Up   Current Method No Method - Other Reason   PM   Reason for No Current Contraceptive Method at Intake ([facility] Only) Other  End Method No Method - Other Reason   PM   Contraception Counseling Provided No              Examination chaperoned by Clarita Salt LPN  Impression and plan: 1. Encounter for gynecological examination with Papanicolaou smear of cervix (Primary) Pap sent Pap in 3 years if negative Physical in 1 year Labs with PCP Mammogram was negative 06/30/23 Colonoscopy per GI Stay active  - Cytology - PAP( [place])  2. Encounter for screening fecal occult blood testing Hemoccult was negative  - POCT occult blood stool

## 2023-12-22 LAB — CYTOLOGY - PAP
Comment: NEGATIVE
Diagnosis: NEGATIVE
High risk HPV: NEGATIVE

## 2024-05-17 ENCOUNTER — Other Ambulatory Visit: Payer: Self-pay | Admitting: Adult Health

## 2024-05-17 DIAGNOSIS — Z1231 Encounter for screening mammogram for malignant neoplasm of breast: Secondary | ICD-10-CM

## 2024-06-19 ENCOUNTER — Encounter: Payer: Self-pay | Admitting: Physician Assistant

## 2024-06-20 ENCOUNTER — Encounter (INDEPENDENT_AMBULATORY_CARE_PROVIDER_SITE_OTHER): Payer: Self-pay | Admitting: Gastroenterology

## 2024-06-30 ENCOUNTER — Ambulatory Visit

## 2024-07-12 ENCOUNTER — Ambulatory Visit
Admission: RE | Admit: 2024-07-12 | Discharge: 2024-07-12 | Disposition: A | Discharge status: Home / Self Care | Source: Ambulatory Visit | Attending: Adult Health | Admitting: Adult Health

## 2024-07-12 DIAGNOSIS — Z1231 Encounter for screening mammogram for malignant neoplasm of breast: Secondary | ICD-10-CM

## 2024-07-17 ENCOUNTER — Encounter (INDEPENDENT_AMBULATORY_CARE_PROVIDER_SITE_OTHER): Payer: Self-pay | Admitting: Gastroenterology

## 2024-07-17 ENCOUNTER — Ambulatory Visit: Payer: Self-pay | Admitting: Adult Health

## 2024-08-10 ENCOUNTER — Other Ambulatory Visit: Payer: Self-pay

## 2024-08-10 DIAGNOSIS — D72819 Decreased white blood cell count, unspecified: Secondary | ICD-10-CM

## 2024-08-11 ENCOUNTER — Inpatient Hospital Stay: Payer: BC Managed Care – PPO | Attending: Oncology

## 2024-08-11 DIAGNOSIS — R0981 Nasal congestion: Secondary | ICD-10-CM | POA: Insufficient documentation

## 2024-08-11 DIAGNOSIS — D72819 Decreased white blood cell count, unspecified: Secondary | ICD-10-CM | POA: Diagnosis present

## 2024-08-11 DIAGNOSIS — K5190 Ulcerative colitis, unspecified, without complications: Secondary | ICD-10-CM | POA: Insufficient documentation

## 2024-08-11 LAB — CBC WITH DIFFERENTIAL/PLATELET
Abs Immature Granulocytes: 0.01 K/uL (ref 0.00–0.07)
Basophils Absolute: 0 K/uL (ref 0.0–0.1)
Basophils Relative: 1 %
Eosinophils Absolute: 0 K/uL (ref 0.0–0.5)
Eosinophils Relative: 1 %
HCT: 40.1 % (ref 36.0–46.0)
Hemoglobin: 13.3 g/dL (ref 12.0–15.0)
Immature Granulocytes: 0 %
Lymphocytes Relative: 27 %
Lymphs Abs: 0.8 K/uL (ref 0.7–4.0)
MCH: 32.3 pg (ref 26.0–34.0)
MCHC: 33.2 g/dL (ref 30.0–36.0)
MCV: 97.3 fL (ref 80.0–100.0)
Monocytes Absolute: 0.5 K/uL (ref 0.1–1.0)
Monocytes Relative: 15 %
Neutro Abs: 1.6 K/uL — ABNORMAL LOW (ref 1.7–7.7)
Neutrophils Relative %: 56 %
Platelets: 228 K/uL (ref 150–400)
RBC: 4.12 MIL/uL (ref 3.87–5.11)
RDW: 12.7 % (ref 11.5–15.5)
WBC: 2.9 K/uL — ABNORMAL LOW (ref 4.0–10.5)
nRBC: 0 % (ref 0.0–0.2)

## 2024-08-11 LAB — COMPREHENSIVE METABOLIC PANEL WITH GFR
ALT: 12 U/L (ref 0–44)
AST: 21 U/L (ref 15–41)
Albumin: 4.5 g/dL (ref 3.5–5.0)
Alkaline Phosphatase: 58 U/L (ref 38–126)
Anion gap: 11 (ref 5–15)
BUN: 14 mg/dL (ref 6–20)
CO2: 26 mmol/L (ref 22–32)
Calcium: 9.5 mg/dL (ref 8.9–10.3)
Chloride: 104 mmol/L (ref 98–111)
Creatinine, Ser: 0.75 mg/dL (ref 0.44–1.00)
GFR, Estimated: >60 mL/min (ref >60)
Glucose, Bld: 95 mg/dL (ref 70–99)
Potassium: 4 mmol/L (ref 3.5–5.1)
Sodium: 141 mmol/L (ref 135–145)
Total Bilirubin: 0.6 mg/dL (ref 0.0–1.2)
Total Protein: 6.9 g/dL (ref 6.5–8.1)

## 2024-08-11 LAB — LACTATE DEHYDROGENASE: LDH: 171 U/L (ref 98–192)

## 2024-08-17 ENCOUNTER — Ambulatory Visit (INDEPENDENT_AMBULATORY_CARE_PROVIDER_SITE_OTHER): Admitting: Gastroenterology

## 2024-08-17 VITALS — BP 108/64 | HR 76 | Temp 97.8°F | Ht 63.0 in | Wt 129.1 lb

## 2024-08-17 DIAGNOSIS — K5180 Other ulcerative colitis without complications: Secondary | ICD-10-CM | POA: Diagnosis not present

## 2024-08-17 DIAGNOSIS — D72819 Decreased white blood cell count, unspecified: Secondary | ICD-10-CM | POA: Diagnosis not present

## 2024-08-17 NOTE — Progress Notes (Unsigned)
 Toribio Fortune, M.D. Gastroenterology & Hepatology [facility] [address] [place], [place] [address]  Primary Care Physician: Grooms, Charmaine, [place] [address] [place] [id number]  I will communicate my assessment and recommendations to the referring MD via EMR.  Problems: Ulcerative colitis   History of Present Illness: Sandra Crane is a 58 y.o. female with past medical history of ulcerative colitis, arthritis and hyperlipidemia, who presents for follow up of UC.  The patient was last seen on 08/02/2023. At that time, the patient was continued on Lialda  1.2 g twice a day and was recommended to have flu and pneumonia vaccinations.  Most recent blood workup from 08/11/2024 showed mildly decreased WBC of 2.9, hemoglobin 13.3 and platelets 228.  Notably her WBC last year was 3.4.  She has had chronic leukopenia that has fluctuated since 2015 but never below 3000.  Rest of CBC was within normal limits.  Had negative fecal occult blood test, CMP was normal.  She currently follows with hematology for chronic leukopenia. Will see Delon Hope tomorrow.  Patient denies any complaints. Denies having any issues with medication, and actually her dry mouth has improved as well. The patient denies having any nausea, vomiting, fever, chills, hematochezia, melena, hematemesis, abdominal distention, abdominal pain, diarrhea, jaundice, pruritus or weight loss.  Last flu shot:2024 Last pneumonia shot:possibly yes, but not sure Last Pap smear: 2025, normal Last zoster vaccine: received in the past COVID-19 shot: possibly 3 doses  Last Colonoscopy:(08/08/20)- The examined portion of the ileum was normal.- The entire examined colon is normal.- External hemorrhoids. - No specimens collected.   Recommend repeat colonoscopy in 5 years  Past Medical History: Past Medical History:  Diagnosis Date   Arthritis    Contraceptive  management 09/12/2013   Elevated cholesterol    Ulcerative colitis     Past Surgical History: Past Surgical History:  Procedure Laterality Date   CESAREAN SECTION  [phone]   COLONOSCOPY     COLONOSCOPY N/A 10/24/2015   Procedure: COLONOSCOPY;  Surgeon: Claudis RAYMOND Rivet, MD;  [facility];  Service: Endoscopy;  Laterality: N/A;  12:00   COLONOSCOPY N/A 08/08/2020   REhman: The examined portion of the ileum was normal.- The entire examined colon is normal.- External hemorrhoids. no specimens collected   FOOT SURGERY Right 02/21/2020   METATARSAL OSTEOTOMY Right 02/21/2020   Procedure: DISTAL METATARSAL OSTEOTOMY;  Surgeon: Tobie Buckles, DPM;  Location: [facility];  Service: Podiatry;  Laterality: Right;   REPAIR EXTENSOR TENDON Right 02/21/2020   Procedure: LENGTHENING OF EXTENSOR TENDON;  Surgeon: Tobie Buckles, DPM;  Location: [facility];  Service: Podiatry;  Laterality: Right;   root canal with crown     SIGMOIDOSCOPY  07/19/2009   TONSILLECTOMY AND ADENOIDECTOMY N/A 11/10/2016   Procedure: TONSILLECTOMY AND ADENOIDECTOMY;  Surgeon: Johnhenry Tippin Moccasin, MD;  Location: [facility];  Service: ENT;  Laterality: N/A;   WISDOM TOOTH EXTRACTION      Family History: Family History  Problem Relation Age of Onset   Thyroid cancer Mother    Diabetes Mother    Cancer Mother    Diabetes Father    Hypertension Sister    Hyperlipidemia Sister    Diabetes Sister    Healthy Brother    Healthy Sister    Healthy Brother    Glaucoma Brother    Healthy Daughter    Healthy Daughter    Healthy Son     Social History: Social History  Tobacco Use  Smoking Status Never  Smokeless Tobacco Never   Social History   Substance and Sexual Activity  Alcohol Use Yes   Comment: occ   Social History   Substance and Sexual Activity  Drug Use No    Allergies: Allergies  Allergen Reactions   Tramadol Other (See Comments)    Insomnia    Medications: Current  Outpatient Medications  Medication Sig Dispense Refill   Boswellia-Glucosamine-Vit D (OSTEO BI-FLEX ONE PER DAY PO) Take 1 tablet by mouth daily.      mesalamine  (LIALDA ) 1.2 g EC tablet Take 1 tablet (1.2 g total) by mouth 2 (two) times daily. 180 tablet 3   Multiple Vitamin (MULTIVITAMIN WITH MINERALS) TABS tablet Take 1 tablet by mouth daily. With Iron     potassium chloride  SA (KLOR-CON  M) 20 MEQ tablet Take 1 tablet (20 mEq total) by mouth daily. (Patient taking differently: Take 20 mEq by mouth daily. Patient using OTC, not prescription strength.) 30 tablet 0   simvastatin  (ZOCOR ) 80 MG tablet Take 80 mg by mouth daily.     No current facility-administered medications for this visit.    Review of Systems: GENERAL: negative for malaise, night sweats HEENT: No changes in hearing or vision, no nose bleeds or other nasal problems. NECK: Negative for lumps, goiter, pain and significant neck swelling RESPIRATORY: Negative for cough, wheezing CARDIOVASCULAR: Negative for chest pain, leg swelling, palpitations, orthopnea GI: SEE HPI MUSCULOSKELETAL: Negative for joint pain or swelling, back pain, and muscle pain. SKIN: Negative for lesions, rash PSYCH: Negative for sleep disturbance, mood disorder and recent psychosocial stressors. HEMATOLOGY Negative for prolonged bleeding, bruising easily, and swollen nodes. ENDOCRINE: Negative for cold or heat intolerance, polyuria, polydipsia and goiter. NEURO: negative for tremor, gait imbalance, syncope and seizures. The remainder of the review of systems is noncontributory.   Physical Exam: BP 108/64 (BP Location: Left Arm, Patient Position: Sitting, Cuff Size: Normal)   Pulse 76   Temp 97.8 F (36.6 C) (Temporal)   Ht 5' 3 (1.6 m)   Wt 129 lb 1.6 oz (58.6 kg)   BMI 22.87 kg/m  GENERAL: The patient is AO x3, in no acute distress. HEENT: Head is normocephalic and atraumatic. EOMI are intact. Mouth is well hydrated and without lesions. NECK:  Supple. No masses LUNGS: Clear to auscultation. No presence of rhonchi/wheezing/rales. Adequate chest expansion HEART: RRR, normal s1 and s2. ABDOMEN: Soft, nontender, no guarding, no peritoneal signs, and nondistended. BS +. No masses. RECTAL EXAM: no external lesions, normal tone, no masses, brown stool without blood.*** Chaperone: EXTREMITIES: Without any cyanosis, clubbing, rash, lesions or edema. NEUROLOGIC: AOx3, no focal motor deficit. SKIN: no jaundice, no rashes  Imaging/Labs: as above  I personally reviewed and interpreted the available labs, imaging and endoscopic files.  Impression and Plan: Rashaun Curl is a 58 y.o. female coming for follow up of ***   All questions were answered.      Toribio Fortune, MD Gastroenterology and Hepatology [facility]

## 2024-08-17 NOTE — Patient Instructions (Signed)
 Continue Lialda  1.2 g twice a day Please obtain flu vaccination.  Ask if you already received pneumonia vaccination - if not, please obtain this vaccine

## 2024-08-18 ENCOUNTER — Inpatient Hospital Stay: Payer: BC Managed Care – PPO | Admitting: Oncology

## 2024-08-18 VITALS — BP 118/84 | HR 71 | Temp 98.2°F | Resp 18 | Wt 129.0 lb

## 2024-08-18 DIAGNOSIS — R0981 Nasal congestion: Secondary | ICD-10-CM | POA: Insufficient documentation

## 2024-08-18 DIAGNOSIS — D72819 Decreased white blood cell count, unspecified: Secondary | ICD-10-CM

## 2024-08-18 MED ORDER — FLUTICASONE PROPIONATE 50 MCG/ACT NA SUSP: 2.0000 | Freq: Every day | NASAL | 2 refills | Status: AC

## 2024-08-18 NOTE — Assessment & Plan Note (Addendum)
-  Most recent CBC (08/10/2024): WBC at 2.9 with ANC 1.6.  -Lab work has been fairly stable over the past several years. -Leukopenia likely reactive in the setting of chronic inflammation due to mesalamine  and UC.  -She is followed closely by GI who has been checking her blood counts every 3 to 6 months and adjusting medication as needed. -RTC in 1 year with labs a few days before. -Would consider additional testing such as flow cytometry and/or bone marrow biopsy if significant change from baseline.

## 2024-08-18 NOTE — Assessment & Plan Note (Addendum)
 Discussed restarting an antihistamine and Flonase 2 sprays each nostril once or twice daily. Recommend she follow-up with ear nose and throat regarding swallowing trouble and chronic left-sided sinus tenderness with ear pain.

## 2024-08-18 NOTE — Progress Notes (Signed)
 [facility] OFFICE PROGRESS NOTE  Sandra Crane, Sandra Crane, [place]  ASSESSMENT & PLAN:    Assessment & Plan Chronic leukopenia -Most recent CBC (08/10/2024): WBC at 2.9 with ANC 1.6.  -Lab work has been fairly stable over the past several years. -Leukopenia likely reactive in the setting of chronic inflammation due to mesalamine  and UC.  -She is followed closely by GI who has been checking her blood counts every 3 to 6 months and adjusting medication as needed. -RTC in 1 year with labs a few days before. -Would consider additional testing such as flow cytometry and/or bone marrow biopsy if significant change from baseline. Sinus congestion Discussed restarting an antihistamine and Flonase 2 sprays each nostril once or twice daily. Recommend she follow-up with ear nose and throat regarding swallowing trouble and chronic left-sided sinus tenderness with ear pain.  Orders Placed This Encounter  Procedures   CBC with Differential    Standing Status:   Future    Expected Date:   08/18/2025    Expiration Date:   11/16/2025   Comprehensive metabolic panel    Standing Status:   Future    Expected Date:   08/18/2025    Expiration Date:   11/16/2025   Lactate dehydrogenase    Standing Status:   Future    Expected Date:   08/18/2025    Expiration Date:   11/16/2025    INTERVAL HISTORY: Sandra Crane 58 y.o. female returns for routine follow-up of her intermittent leukopenia.     In the interim, she denies any hospitalizations, surgeries or changes in baseline health.  Appetite and energy levels are 100%.  Denies any pain.   Patient was recently seen by GI for ulcerative colitis.  No recent flares.  She thinks she is in remission.   She denies any recurrent infections.  Reports she has left-sided postnasal issues along with frontal sinus and ear discomfort.  She has tried several over-the-counter medications but none have been effective.  She has been seen by ENT in the past but not  recently.  Reports in the past year she has had 4 episodes of some sort of spasm making it hard for her to breathe while she is eating.  Denies any choking.  Reports she will feel the sensation coming on and eventually it will just ease up and she can breathe/swallow.  She has not been seen for this.    We reviewed CBC, CMP and LDH.  SUMMARY OF HEMATOLOGIC HISTORY: Oncology History Overview Note  1.  Chronic intermittent leukopenia with mild neutropenia - Patient seen at the request of Dr. Bertell for leukopenia. - CBC (08/08/2022): WBC 2.8, differential 54% neutrophils, 27% lymphocytes, 16% monocytes, 2% eosinophils.  ANC-1.5, ALC-0.8.  Hb and PLT normal. - B12 763, folic acid normal. - No B symptoms.  No recurrent infections.  No prior history of transfusion. - Review of labs show patient has intermittent leukopenia for the last 5 to 10 years. - She has a history of ulcerative colitis for more than 10 years and is currently on mesalamine  since approximately 2012.  Previously used drugs include Lialda , Remicade and 6-MP. - Hematology work-up (08/12/2022) was essentially normal.  Negative hepatitis B/hepatitis C.  Negative RF/ANA.  Normal copper  and MMA.  SPEP negative.  Normal LDH and reticulocytes.      No history exists.     CBC    Component Value Date/Time   WBC 2.9 (L) 08/11/2024 0946   RBC 4.12 08/11/2024 0946  HGB 13.3 08/11/2024 0946   HCT 40.1 08/11/2024 0946   PLT 228 08/11/2024 0946   MCV 97.3 08/11/2024 0946   MCH 32.3 08/11/2024 0946   MCHC 33.2 08/11/2024 0946   RDW 12.7 08/11/2024 0946   LYMPHSABS 0.8 08/11/2024 0946   MONOABS 0.5 08/11/2024 0946   EOSABS 0.0 08/11/2024 0946   BASOSABS 0.0 08/11/2024 0946       Latest Ref Rng & Units 08/11/2024    9:46 AM 08/11/2023    8:15 AM 11/12/2022    8:27 AM  CMP  Glucose 70 - 99 mg/dL 95  94  95   BUN 6 - 20 mg/dL 14  13  11    Creatinine 0.44 - 1.00 mg/dL 9.24  9.38  9.34   Sodium 135 - 145 mmol/L 141  139  142    Potassium 3.5 - 5.1 mmol/L 4.0  3.2  3.8   Chloride 98 - 111 mmol/L 104  103  106   CO2 22 - 32 mmol/L 26  27  30    Calcium 8.9 - 10.3 mg/dL 9.5  9.0  9.2   Total Protein 6.5 - 8.1 g/dL 6.9   6.5   Total Bilirubin 0.0 - 1.2 mg/dL 0.6   0.5   Alkaline Phos 38 - 126 U/L 58     AST 15 - 41 U/L 21   23   ALT 0 - 44 U/L 12   24      No results found for: FERRITIN, VITAMINB12  Vitals:   08/18/24 1013  BP: 118/84  Pulse: 71  Resp: 18  Temp: 98.2 F (36.8 C)  SpO2: 100%    Review of System:  Review of Systems  HENT:  Positive for congestion, ear pain and sinus pain. Negative for sore throat.   Respiratory:  Positive for shortness of breath.   Psychiatric/Behavioral:  The patient has insomnia.     Physical Exam: Physical Exam Constitutional:      Appearance: Normal appearance.  HENT:     Head: Normocephalic and atraumatic.     Right Ear: A middle ear effusion is present.     Left Ear: A middle ear effusion is present.     Nose:     Right Sinus: No maxillary sinus tenderness or frontal sinus tenderness.     Left Sinus: Maxillary sinus tenderness and frontal sinus tenderness present.  Eyes:     Pupils: Pupils are equal, round, and reactive to light.  Cardiovascular:     Rate and Rhythm: Normal rate and regular rhythm.     Heart sounds: Normal heart sounds. No murmur heard. Pulmonary:     Effort: Pulmonary effort is normal.     Breath sounds: Normal breath sounds. No wheezing.  Abdominal:     General: Bowel sounds are normal. There is no distension.     Palpations: Abdomen is soft.     Tenderness: There is no abdominal tenderness.  Musculoskeletal:        General: Normal range of motion.     Cervical back: Normal range of motion.  Skin:    General: Skin is warm and dry.     Findings: No rash.  Neurological:     Mental Status: She is alert and oriented to person, place, and time.     Gait: Gait is intact.  Psychiatric:        Mood and Affect: Mood and affect  normal.        Cognition and Memory: Memory  normal.        Judgment: Judgment normal.      I spent 20 minutes dedicated to the care of this patient (face-to-face and non-face-to-face) on the date of the encounter to include what is described in the assessment and plan.,  Delon Hope, NP 08/18/2024 10:54 AM

## 2024-08-21 ENCOUNTER — Ambulatory Visit: Admitting: Physician Assistant

## 2024-08-22 ENCOUNTER — Encounter: Payer: Self-pay | Admitting: Physician Assistant

## 2024-08-23 ENCOUNTER — Encounter (INDEPENDENT_AMBULATORY_CARE_PROVIDER_SITE_OTHER): Payer: Self-pay | Admitting: Gastroenterology

## 2024-08-24 ENCOUNTER — Ambulatory Visit: Admitting: Physician Assistant

## 2024-08-24 ENCOUNTER — Encounter: Payer: Self-pay | Admitting: Physician Assistant

## 2024-08-24 VITALS — BP 105/68 | HR 60 | Temp 97.9°F | Ht 63.0 in | Wt 129.0 lb

## 2024-08-24 DIAGNOSIS — E782 Mixed hyperlipidemia: Secondary | ICD-10-CM | POA: Diagnosis not present

## 2024-08-24 DIAGNOSIS — Z23 Encounter for immunization: Secondary | ICD-10-CM | POA: Diagnosis not present

## 2024-08-24 DIAGNOSIS — Z7689 Persons encountering health services in other specified circumstances: Secondary | ICD-10-CM

## 2024-08-24 MED ORDER — SIMVASTATIN 80 MG PO TABS: 80.0000 mg | ORAL_TABLET | Freq: Every day | ORAL | 3 refills | Status: AC

## 2024-08-24 NOTE — Assessment & Plan Note (Signed)
 Stable. Continue with current management without changes. Discussed healthy diet and lifestyle. Lipid panel today.  Medication refilled for 1 year.

## 2024-08-24 NOTE — Progress Notes (Signed)
 New Patient Office Visit  Subjective    Patient ID: Sandra Crane, female    DOB: 12-09-1965  Age: 58 y.o. MRN: [id number]  CC:  Chief Complaint  Patient presents with   cholesterol check     HPI Nishita Isaacks presents to establish care  Patient presents today with past medical history significant for hyperlipidemia, ulcerative colitis, and leukopenia. Patient follows regularly with GI and hematology. Patient follows with [facility] for yearly well woman exams. Patient requesting cholesterol mediation refill and updated lipid panel today. She endorses overall healthy diet and reports physical activity most days of the week. She denies adverse medication reactions such as myalgias or weakness. She is up to date on her mammogram, Pap, and colonoscopy. She would like her flu shot today.   Outpatient Encounter Medications as of 08/24/2024  Medication Sig   Boswellia-Glucosamine-Vit D (OSTEO BI-FLEX ONE PER DAY PO) Take 1 tablet by mouth daily.    fluticasone (FLONASE) 50 MCG/ACT nasal spray Place 2 sprays into both nostrils daily.   mesalamine  (LIALDA ) 1.2 g EC tablet Take 1 tablet (1.2 g total) by mouth 2 (two) times daily.   Multiple Vitamin (MULTIVITAMIN WITH MINERALS) TABS tablet Take 1 tablet by mouth daily. With Iron   [DISCONTINUED] potassium chloride  SA (KLOR-CON  M) 20 MEQ tablet Take 1 tablet (20 mEq total) by mouth daily. (Patient taking differently: Take 20 mEq by mouth daily. Patient using OTC, not prescription strength.)   [DISCONTINUED] simvastatin  (ZOCOR ) 80 MG tablet Take 80 mg by mouth daily.   simvastatin  (ZOCOR ) 80 MG tablet Take 1 tablet (80 mg total) by mouth daily.   No facility-administered encounter medications on file as of 08/24/2024.    Past Medical History:  Diagnosis Date   Arthritis    Contraceptive management 09/12/2013   Elevated cholesterol    Ulcerative colitis     Past Surgical History:  Procedure Laterality Date   CESAREAN  SECTION  [id number]   COLONOSCOPY     COLONOSCOPY N/A 10/24/2015   Procedure: COLONOSCOPY;  Surgeon: Claudis RAYMOND Rivet, MD;  Location: [facility];  Service: Endoscopy;  Laterality: N/A;  12:00   COLONOSCOPY N/A 08/08/2020   REhman: The examined portion of the ileum was normal.- The entire examined colon is normal.- External hemorrhoids. no specimens collected   FOOT SURGERY Right 02/21/2020   METATARSAL OSTEOTOMY Right 02/21/2020   Procedure: DISTAL METATARSAL OSTEOTOMY;  Surgeon: Tobie Buckles, DPM;  Location: [facility];  Service: Podiatry;  Laterality: Right;   REPAIR EXTENSOR TENDON Right 02/21/2020   Procedure: LENGTHENING OF EXTENSOR TENDON;  Surgeon: Tobie Buckles, DPM;  Location: [facility];  Service: Podiatry;  Laterality: Right;   root canal with crown     SIGMOIDOSCOPY  07/19/2009   TONSILLECTOMY AND ADENOIDECTOMY N/A 11/10/2016   Procedure: TONSILLECTOMY AND ADENOIDECTOMY;  Surgeon: Daniel Moccasin, MD;  Location: [facility];  Service: ENT;  Laterality: N/A;   WISDOM TOOTH EXTRACTION      Family History  Problem Relation Age of Onset   Thyroid cancer Mother    Diabetes Mother    Cancer Mother    Diabetes Father    Hypertension Sister    Hyperlipidemia Sister    Diabetes Sister    Healthy Brother    Healthy Sister    Healthy Brother    Glaucoma Brother    Healthy Daughter    Healthy Daughter    Healthy Son     Social History   Socioeconomic History  Marital status: Married    Spouse name: Not on file   Number of children: 3   Years of education: Not on file   Highest education level: 12th grade  Occupational History   Not on file  Tobacco Use   Smoking status: Never    Passive exposure: Never   Smokeless tobacco: Never  Vaping Use   Vaping status: Never Used  Substance and Sexual Activity   Alcohol use: Yes    Comment: occ   Drug use: No   Sexual activity: Yes    Birth control/protection: Post-menopausal  Other Topics Concern    Not on file  Social History Narrative   Not on file   Social Drivers of Health   Financial Resource Strain: Low Risk  (08/20/2024)   Overall Financial Resource Strain (CARDIA)    Difficulty of Paying Living Expenses: Not hard at all  Food Insecurity: No Food Insecurity (08/20/2024)   Hunger Vital Sign    Worried About Running Out of Food in the Last Year: Never true    Ran Out of Food in the Last Year: Never true  Transportation Needs: No Transportation Needs (08/20/2024)   PRAPARE - Administrator, Civil Service (Medical): No    Lack of Transportation (Non-Medical): No  Physical Activity: Sufficiently Active (08/20/2024)   Exercise Vital Sign    Days of Exercise per Week: 6 days    Minutes of Exercise per Session: 30 min  Stress: No Stress Concern Present (08/20/2024)   [employer] of Occupational Health - Occupational Stress Questionnaire    Feeling of Stress: Not at all  Social Connections: Unknown (08/20/2024)   Social Connection and Isolation Panel    Frequency of Communication with Friends and Family: More than three times a week    Frequency of Social Gatherings with Friends and Family: More than three times a week    Attends Religious Services: Patient declined    Database administrator or Organizations: Patient declined    Attends Banker Meetings: Not on file    Marital Status: Married  Intimate Partner Violence: Not At Risk (12/16/2023)   Humiliation, Afraid, Rape, and Kick questionnaire    Fear of Current or Ex-Partner: No    Emotionally Abused: No    Physically Abused: No    Sexually Abused: No    Review of Systems  Constitutional:  Negative for chills, fever and malaise/fatigue.  Eyes:  Negative for blurred vision and double vision.  Respiratory:  Negative for cough and shortness of breath.   Cardiovascular:  Negative for chest pain and palpitations.  Musculoskeletal:  Negative for joint pain and myalgias.  Neurological:   Negative for dizziness and headaches.        Objective    BP 105/68   Pulse 60   Temp 97.9 F (36.6 C)   Ht 5' 3 (1.6 m)   Wt 129 lb (58.5 kg)   SpO2 97%   BMI 22.85 kg/m   Physical Exam Constitutional:      General: She is not in acute distress.    Appearance: Normal appearance. She is normal weight. She is not ill-appearing.  HENT:     Head: Normocephalic and atraumatic.     Mouth/Throat:     Mouth: Mucous membranes are moist.     Pharynx: Oropharynx is clear.  Eyes:     Extraocular Movements: Extraocular movements intact.     Conjunctiva/sclera: Conjunctivae normal.  Cardiovascular:     Rate  and Rhythm: Normal rate and regular rhythm.     Heart sounds: Normal heart sounds. No murmur heard. Pulmonary:     Effort: Pulmonary effort is normal.     Breath sounds: Normal breath sounds.  Musculoskeletal:     Right lower leg: No edema.     Left lower leg: No edema.  Skin:    General: Skin is warm and dry.  Neurological:     General: No focal deficit present.     Mental Status: She is alert and oriented to person, place, and time.  Psychiatric:        Mood and Affect: Mood normal.        Behavior: Behavior normal.       Assessment & Plan:  Encounter to establish care  Mixed hyperlipidemia Assessment & Plan: Stable. Continue with current management without changes. Discussed healthy diet and lifestyle. Lipid panel today.  Medication refilled for 1 year.   Orders: -     Lipid panel -     Simvastatin ; Take 1 tablet (80 mg total) by mouth daily.  Dispense: 90 tablet; Refill: 3  Need for vaccination -     Flu vaccine trivalent PF, 6mos and older(Flulaval,Afluria,Fluarix,Fluzone)    Return in about 6 months (around 02/22/2025).   Charmaine Perpetua Elling, PA-C

## 2024-08-25 ENCOUNTER — Ambulatory Visit: Payer: Self-pay | Admitting: Physician Assistant

## 2024-08-25 LAB — LIPID PANEL
Chol/HDL Ratio: 3.2 ratio (ref 0.0–4.4)
Cholesterol, Total: 215 mg/dL — ABNORMAL HIGH (ref 100–199)
HDL: 67 mg/dL (ref >39)
LDL Chol Calc (NIH): 126 mg/dL — ABNORMAL HIGH (ref 0–99)
Triglycerides: 126 mg/dL (ref 0–149)
VLDL Cholesterol Cal: 22 mg/dL (ref 5–40)

## 2024-09-05 ENCOUNTER — Other Ambulatory Visit (INDEPENDENT_AMBULATORY_CARE_PROVIDER_SITE_OTHER): Payer: Self-pay | Admitting: Gastroenterology

## 2024-09-05 ENCOUNTER — Encounter (INDEPENDENT_AMBULATORY_CARE_PROVIDER_SITE_OTHER): Payer: Self-pay | Admitting: Gastroenterology

## 2024-09-05 DIAGNOSIS — K5180 Other ulcerative colitis without complications: Secondary | ICD-10-CM

## 2024-09-05 MED ORDER — MESALAMINE 1.2 G PO TBEC: 1.2000 g | DELAYED_RELEASE_TABLET | Freq: Two times a day (BID) | ORAL | 3 refills | Status: AC

## 2024-12-13 ENCOUNTER — Ambulatory Visit: Payer: Self-pay

## 2024-12-13 NOTE — Telephone Encounter (Signed)
 FYI Only or Action Required?: FYI only for provider: appointment scheduled on 2/5.  Patient was last seen in primary care on 08/24/2024 by Grooms, [place], [place].  Called Nurse Triage reporting Ear Fullness.  Symptoms began several days ago.  Interventions attempted: Rest, hydration, or home remedies.  Symptoms are: gradually worsening.  Triage Disposition: See PCP When Office is Open (Within 3 Days)  Patient/caregiver understands and will follow disposition?: Yes  Message from [place] R sent at 12/13/2024  1:08 PM EST  Reason for Triage: Patient states she has fluid in her ears, been about 2 weeks. States it is causing vertigo/dizziness. States it is worse today than it has been   Reason for Disposition  [1] Ear congestion lasts > 3 days AND [2] no improvement after using Care Advice  (Exception: Ear congestion is a chronic symptom.)  Answer Assessment - Initial Assessment Questions Pressure in her left ear for 4-5days. Feels like there might be fluid but denies drainage. Causing some spinning with quick head turns and bending down.  Denies fever, SOB, cough. Does have some left facial congestion/sinus congestion.   Appt with pcp to assess. Understands ED precautions   1. LOCATION: Which ear is involved?       Left ear/ face pressure 2. SENSATION: Describe how the ear feels. (e.g., stuffy, full, plugged).      Congestion and pressure  3. ONSET:  When did the ear symptoms start?       3-4 weeks  4. PAIN: Do you also have an earache? If Yes, ask: How bad is it? (Scale 0-10; none, mild, moderate or severe)     Denies pain just pressure  5. CAUSE: What do you think is causing the ear congestion? (e.g., common cold, nasal allergies, recent flight, recent snorkeling)     Ear infection/sinus infection  6. OTHER SYMPTOMS: Do you have any other symptoms? (e.g., ear drainage, hay fever symptoms such as sneezing or a clear nasal discharge; cold symptoms such as a cough or  runny nose)     Face fullness, left sinus congestion, vertigo  Protocols used: Ear - Congestion-A-AH

## 2024-12-14 ENCOUNTER — Encounter: Payer: Self-pay | Admitting: Physician Assistant

## 2024-12-14 ENCOUNTER — Ambulatory Visit: Admitting: Physician Assistant

## 2024-12-14 VITALS — BP 110/75 | HR 66 | Temp 97.9°F | Ht 63.0 in | Wt 143.0 lb

## 2024-12-14 DIAGNOSIS — R42 Dizziness and giddiness: Secondary | ICD-10-CM | POA: Insufficient documentation

## 2024-12-14 MED ORDER — MECLIZINE HCL 25 MG PO TABS: 25.0000 mg | ORAL_TABLET | Freq: Three times a day (TID) | ORAL | 0 refills | Status: AC | PRN

## 2024-12-14 NOTE — Assessment & Plan Note (Signed)
 Intermittent vertigo with head pressure for three and a half weeks. No nausea, vomiting, or ear pain. Previous episodes managed with meclizine . Current episode not resolving with conservative measures. No fluid or abnormalities in ears on examination. Possible vestibular dysfunction due to misaligned otoliths. - Prescribed meclizine  as needed, up to three times daily, with caution for drowsiness. - Restart Flonase  nasal spray at bedtime to aid fluid drainage. Consider Zyrtec in the morning for additional symptomatic relief. - Provided information on vestibular rehabilitation exercises for home management. - Advised follow-up if symptoms persist beyond one week or if interested in vestibular rehabilitation. - Suggested ENT evaluation if symptoms persist beyond one month.

## 2024-12-14 NOTE — Progress Notes (Signed)
 "  Acute Office Visit  Subjective:     Patient ID: Sandra Crane, female    DOB: 1966-02-08, 59 y.o.   MRN: [id number]   Discussed the use of AI scribe software for clinical note transcription with the patient, who gave verbal consent to proceed.  History of Present Illness Sandra Crane is a 59 year old female who presents with persistent dizziness and head pressure.  She has been experiencing dizziness and vertigo for the past three and a half weeks, described as a lingering pressure in her head. Episodes of dizziness occur when she looks up or moves forward. Although the sensation of the room spinning has decreased, she still feels 'swimmy headed'.  She has a history of vertigo diagnosed years ago, previously managed with meclizine . In the past, symptoms resolved over time with meclizine  or by limiting movement, but this episode has been more persistent.  No nausea, vomiting, ear pain, or headaches, but there is a constant head pressure. She mentions a previous sensation of fluid in her ear, which she associates with her vertigo symptoms.  She has previously used meclizine  as needed and has Flonase  at home. She ran out of Zyrtec or Claritin, which she has used in the past.     Review of Systems  Constitutional:  Negative for activity change, appetite change, fatigue and fever.  HENT:  Negative for congestion, ear discharge, ear pain, hearing loss and sinus pressure.   Neurological:  Positive for dizziness and headaches (head pressure). Negative for weakness and light-headedness.         Objective:     BP 110/75   Pulse 66   Temp 97.9 F (36.6 C)   Ht 5' 3 (1.6 m)   Wt 143 lb (64.9 kg)   SpO2 99%   BMI 25.33 kg/m   Physical Exam Constitutional:      General: She is not in acute distress.    Appearance: Normal appearance. She is normal weight. She is not ill-appearing.  HENT:     Head: Normocephalic and atraumatic.     Right Ear: Tympanic  membrane normal.     Left Ear: Tympanic membrane normal.     Mouth/Throat:     Mouth: Mucous membranes are moist.     Pharynx: Oropharynx is clear.  Eyes:     Extraocular Movements: Extraocular movements intact.     Conjunctiva/sclera: Conjunctivae normal.  Cardiovascular:     Rate and Rhythm: Normal rate and regular rhythm.     Heart sounds: Normal heart sounds. No murmur heard. Pulmonary:     Effort: Pulmonary effort is normal.     Breath sounds: Normal breath sounds. No wheezing, rhonchi or rales.  Skin:    General: Skin is warm and dry.  Neurological:     General: No focal deficit present.     Mental Status: She is alert and oriented to person, place, and time.  Psychiatric:        Mood and Affect: Mood normal.        Behavior: Behavior normal.     No results found for any visits on 12/14/24.      Assessment & Plan:  Vertigo Assessment & Plan: Intermittent vertigo with head pressure for three and a half weeks. No nausea, vomiting, or ear pain. Previous episodes managed with meclizine . Current episode not resolving with conservative measures. No fluid or abnormalities in ears on examination. Possible vestibular dysfunction due to misaligned otoliths. - Prescribed meclizine  as needed, up  to three times daily, with caution for drowsiness. - Restart Flonase  nasal spray at bedtime to aid fluid drainage. Consider Zyrtec in the morning for additional symptomatic relief. - Provided information on vestibular rehabilitation exercises for home management. - Advised follow-up if symptoms persist beyond one week or if interested in vestibular rehabilitation. - Suggested ENT evaluation if symptoms persist beyond one month.  Orders: -     Meclizine  HCl; Take 1 tablet (25 mg total) by mouth 3 (three) times daily as needed for dizziness.  Dispense: 30 tablet; Refill: 0    Return if symptoms worsen or fail to improve.  Johnny Gorter, PA-C  "

## 2025-01-10 ENCOUNTER — Ambulatory Visit: Admitting: Adult Health

## 2025-02-22 ENCOUNTER — Ambulatory Visit: Admitting: Physician Assistant

## 2025-08-10 ENCOUNTER — Other Ambulatory Visit

## 2025-08-17 ENCOUNTER — Ambulatory Visit: Admitting: Oncology
